# Patient Record
Sex: Female | Born: 1955
Health system: Southern US, Community
[De-identification: ages and names within clinical notes are randomized; demographics above are authoritative.]

## PROBLEM LIST (undated history)

## (undated) DIAGNOSIS — I809 Phlebitis and thrombophlebitis of unspecified site: Secondary | ICD-10-CM

## (undated) DIAGNOSIS — Z9889 Other specified postprocedural states: Secondary | ICD-10-CM

## (undated) DIAGNOSIS — E079 Disorder of thyroid, unspecified: Secondary | ICD-10-CM

## (undated) DIAGNOSIS — R112 Nausea with vomiting, unspecified: Secondary | ICD-10-CM

## (undated) DIAGNOSIS — R232 Flushing: Secondary | ICD-10-CM

## (undated) DIAGNOSIS — M1990 Unspecified osteoarthritis, unspecified site: Secondary | ICD-10-CM

## (undated) DIAGNOSIS — T7840XA Allergy, unspecified, initial encounter: Secondary | ICD-10-CM

## (undated) DIAGNOSIS — J40 Bronchitis, not specified as acute or chronic: Secondary | ICD-10-CM

## (undated) DIAGNOSIS — K219 Gastro-esophageal reflux disease without esophagitis: Secondary | ICD-10-CM

## (undated) HISTORY — PX: MOUTH SURGERY: SHX715

## (undated) HISTORY — DX: Disorder of thyroid, unspecified: E07.9

## (undated) HISTORY — PX: BREAST LUMPECTOMY: SHX2

## (undated) HISTORY — DX: Gastro-esophageal reflux disease without esophagitis: K21.9

## (undated) HISTORY — DX: Phlebitis and thrombophlebitis of unspecified site: I80.9

## (undated) HISTORY — PX: TONSILLECTOMY: SUR1361

## (undated) HISTORY — DX: Unspecified osteoarthritis, unspecified site: M19.90

## (undated) HISTORY — DX: Flushing: R23.2

## (undated) HISTORY — DX: Bronchitis, not specified as acute or chronic: J40

## (undated) HISTORY — PX: BREAST BIOPSY: SHX20

## (undated) HISTORY — DX: Allergy, unspecified, initial encounter: T78.40XA

---
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
## 1990-11-09 HISTORY — PX: CERVICAL CONE BIOPSY: SUR198

## 2013-12-06 ENCOUNTER — Encounter (HOSPITAL_COMMUNITY): Payer: Self-pay | Admitting: Emergency Medicine

## 2013-12-06 ENCOUNTER — Emergency Department (HOSPITAL_COMMUNITY)
Admission: EM | Admit: 2013-12-06 | Discharge: 2013-12-06 | Disposition: A | Discharge status: Home / Self Care | Payer: BC Managed Care – PPO | Source: Home / Self Care | Attending: Family Medicine | Admitting: Family Medicine

## 2013-12-06 DIAGNOSIS — J40 Bronchitis, not specified as acute or chronic: Secondary | ICD-10-CM

## 2013-12-06 MED ORDER — IPRATROPIUM BROMIDE 0.06 % NA SOLN: 2.0000 | Freq: Four times a day (QID) | NASAL | Status: DC

## 2013-12-06 MED ORDER — GUAIFENESIN-CODEINE 100-10 MG/5ML PO SOLN: 5.0000 mL | Freq: Every evening | ORAL | Status: DC | PRN

## 2013-12-06 MED ORDER — AZITHROMYCIN 250 MG PO TABS: 250.0000 mg | ORAL_TABLET | Freq: Every day | ORAL | Status: DC

## 2013-12-06 MED ORDER — PREDNISONE 10 MG PO TABS: 30.0000 mg | ORAL_TABLET | Freq: Every day | ORAL | Status: DC

## 2013-12-06 NOTE — ED Notes (Signed)
C/o cold sx for three weeks now States she used OTC medications but no relief.  Admits to suffering from sinus problems Sx are productive yellowish mucous cough, head pressure, nasal drip and throbbing right ear pain

## 2013-12-06 NOTE — Discharge Instructions (Signed)
Thank you for coming in today. Take prednisone and azithromycin daily for 5 days. Use codeine containing cough medication as needed. Use Atrovent nasal spray for runny nose or postnasal drip. Call or go to the emergency room if you get worse, have trouble breathing, have chest pains, or palpitations.   Bronchitis Bronchitis is inflammation of the airways that extend from the windpipe into the lungs (bronchi). The inflammation often causes mucus to develop, which leads to a cough. If the inflammation becomes severe, it may cause shortness of breath. CAUSES  Bronchitis may be caused by:   Viral infections.   Bacteria.   Cigarette smoke.   Allergens, pollutants, and other irritants.  SIGNS AND SYMPTOMS  The most common symptom of bronchitis is a frequent cough that produces mucus. Other symptoms include:  Fever.   Body aches.   Chest congestion.   Chills.   Shortness of breath.   Sore throat.  DIAGNOSIS  Bronchitis is usually diagnosed through a medical history and physical exam. Tests, such as chest X-rays, are sometimes done to rule out other conditions.  TREATMENT  You may need to avoid contact with whatever caused the problem (smoking, for example). Medicines are sometimes needed. These may include:  Antibiotics. These may be prescribed if the condition is caused by bacteria.  Cough suppressants. These may be prescribed for relief of cough symptoms.   Inhaled medicines. These may be prescribed to help open your airways and make it easier for you to breathe.   Steroid medicines. These may be prescribed for those with recurrent (chronic) bronchitis. HOME CARE INSTRUCTIONS  Get plenty of rest.   Drink enough fluids to keep your urine clear or pale yellow (unless you have a medical condition that requires fluid restriction). Increasing fluids may help thin your secretions and will prevent dehydration.   Only take over-the-counter or prescription medicines  as directed by your health care provider.  Only take antibiotics as directed. Make sure you finish them even if you start to feel better.  Avoid secondhand smoke, irritating chemicals, and strong fumes. These will make bronchitis worse. If you are a smoker, quit smoking. Consider using nicotine gum or skin patches to help control withdrawal symptoms. Quitting smoking will help your lungs heal faster.   Put a cool-mist humidifier in your bedroom at night to moisten the air. This may help loosen mucus. Change the water in the humidifier daily. You can also run the hot water in your shower and sit in the bathroom with the door closed for 5 10 minutes.   Follow up with your health care provider as directed.   Wash your hands frequently to avoid catching bronchitis again or spreading an infection to others.  SEEK MEDICAL CARE IF: Your symptoms do not improve after 1 week of treatment.  SEEK IMMEDIATE MEDICAL CARE IF:  Your fever increases.  You have chills.   You have chest pain.   You have worsening shortness of breath.   You have bloody sputum.  You faint.  You have lightheadedness.  You have a severe headache.   You vomit repeatedly. MAKE SURE YOU:   Understand these instructions.  Will watch your condition.  Will get help right away if you are not doing well or get worse. Document Released: 10/26/2005 Document Revised: 08/16/2013 Document Reviewed: 06/20/2013 [facility] Patient Information 2014 [place].

## 2013-12-06 NOTE — ED Provider Notes (Signed)
Catharina Pica is a 58 y.o. female who presents to Urgent Care today for 3 weeks of cough congestion facial pain and pressure and sore throat. Patient denies any significant shortness of breath nausea vomiting fevers or chills. She does note some mild diarrhea. Her symptoms abruptly worsened a few days ago. She's tried multiple over-the-counter medications which have not helped. Symptoms are moderate. Cough is quite bothersome at bedtime.   History reviewed. No pertinent past medical history. History  Substance Use Topics  . Smoking status: Not on file  . Smokeless tobacco: Not on file  . Alcohol Use: Not on file   ROS as above Medications: No current facility-administered medications for this encounter.   Current Outpatient Prescriptions  Medication Sig Dispense Refill  . azithromycin (ZITHROMAX) 250 MG tablet Take 1 tablet (250 mg total) by mouth daily. Take first 2 tablets together, then 1 every day until finished.  6 tablet  0  . guaiFENesin-codeine 100-10 MG/5ML syrup Take 5 mLs by mouth at bedtime as needed for cough.  120 mL  0  . ipratropium (ATROVENT) 0.06 % nasal spray Place 2 sprays into both nostrils 4 (four) times daily.  15 mL  1  . predniSONE (DELTASONE) 10 MG tablet Take 3 tablets (30 mg total) by mouth daily.  15 tablet  0    Exam:  Pulse 73  Temp(Src) 97.9 F (36.6 C) (Oral)  SpO2 96% Gen: Well NAD HEENT: EOMI,  MMM mildly tender bilateral maxillary sinus. Posterior pharynx with cobblestone. Tympanic membranes are normal appearing bilaterally. Lungs: Normal work of breathing. CTABL frequent coughing Heart: RRR no MRG Abd: NABS, Soft. NT, ND Exts: Brisk capillary refill, warm and well perfused.    Assessment and Plan: 59 y.o. female with bronchitis. Possible second sickening. Plan to treat with prednisone, azithromycin, codeine cough medication, and Atrovent nasal spray.  Discussed warning signs or symptoms. Please see discharge instructions. Patient expresses  understanding.    Gregor Hams, MD 12/06/13 [phone]

## 2014-04-25 ENCOUNTER — Ambulatory Visit (INDEPENDENT_AMBULATORY_CARE_PROVIDER_SITE_OTHER): Payer: BC Managed Care – PPO | Admitting: Internal Medicine

## 2014-04-25 ENCOUNTER — Encounter: Payer: Self-pay | Admitting: Internal Medicine

## 2014-04-25 VITALS — BP 120/76 | HR 83 | Temp 98.2°F | Ht 61.75 in | Wt 214.0 lb

## 2014-04-25 DIAGNOSIS — Z0000 Encounter for general adult medical examination without abnormal findings: Secondary | ICD-10-CM

## 2014-04-25 DIAGNOSIS — Z1239 Encounter for other screening for malignant neoplasm of breast: Secondary | ICD-10-CM

## 2014-04-25 LAB — CBC
HCT: 35.7 % — ABNORMAL LOW (ref 36.0–46.0)
Hemoglobin: 11.9 g/dL — ABNORMAL LOW (ref 12.0–15.0)
MCHC: 33.3 g/dL (ref 30.0–36.0)
MCV: 94.6 fl (ref 78.0–100.0)
Platelets: 308 10*3/uL (ref 150.0–400.0)
RBC: 3.78 Mil/uL — ABNORMAL LOW (ref 3.87–5.11)
RDW: 13.3 % (ref 11.5–15.5)
WBC: 5.2 10*3/uL (ref 4.0–10.5)

## 2014-04-25 LAB — COMPREHENSIVE METABOLIC PANEL
ALBUMIN: 4.3 g/dL (ref 3.5–5.2)
ALT: 20 U/L (ref 0–35)
AST: 24 U/L (ref 0–37)
Alkaline Phosphatase: 62 U/L (ref 39–117)
BUN: 8 mg/dL (ref 6–23)
CALCIUM: 9.3 mg/dL (ref 8.4–10.5)
CO2: 34 mEq/L — ABNORMAL HIGH (ref 19–32)
Chloride: 101 mEq/L (ref 96–112)
Creatinine, Ser: 0.6 mg/dL (ref 0.4–1.2)
GFR: 122.13 mL/min (ref >60.00)
Glucose, Bld: 112 mg/dL — ABNORMAL HIGH (ref 70–99)
Potassium: 3.2 mEq/L — ABNORMAL LOW (ref 3.5–5.1)
Sodium: 140 mEq/L (ref 135–145)
Total Bilirubin: 0.2 mg/dL (ref 0.2–1.2)
Total Protein: 7.2 g/dL (ref 6.0–8.3)

## 2014-04-25 LAB — TSH: TSH: 0.24 u[IU]/mL — ABNORMAL LOW (ref 0.35–4.50)

## 2014-04-25 LAB — LIPID PANEL
CHOL/HDL RATIO: 4
Cholesterol: 220 mg/dL — ABNORMAL HIGH (ref 0–200)
HDL: 61.2 mg/dL (ref >39.00)
LDL Cholesterol: 138 mg/dL — ABNORMAL HIGH (ref 0–99)
NONHDL: 158.8
Triglycerides: 106 mg/dL (ref 0.0–149.0)
VLDL: 21.2 mg/dL (ref 0.0–40.0)

## 2014-04-25 LAB — HEMOGLOBIN A1C: Hgb A1c MFr Bld: 5.3 % (ref 4.6–6.5)

## 2014-04-25 NOTE — Patient Instructions (Addendum)
Go to the lab for blood work then see Rosaria Ferries on your way out.  Health Maintenance, Female A healthy lifestyle and preventative care can promote health and wellness.  Maintain regular health, dental, and eye exams.  Eat a healthy diet. Foods like vegetables, fruits, whole grains, low-fat dairy products, and lean protein foods contain the nutrients you need without too many calories. Decrease your intake of foods high in solid fats, added sugars, and salt. Get information about a proper diet from your caregiver, if necessary.  Regular physical exercise is one of the most important things you can do for your health. Most adults should get at least 150 minutes of moderate-intensity exercise (any activity that increases your heart rate and causes you to sweat) each week. In addition, most adults need muscle-strengthening exercises on 2 or more days a week.   Maintain a healthy weight. The body mass index (BMI) is a screening tool to identify possible weight problems. It provides an estimate of body fat based on height and weight. Your caregiver can help determine your BMI, and can help you achieve or maintain a healthy weight. For adults 20 years and older:  A BMI below 18.5 is considered underweight.  A BMI of 18.5 to 24.9 is normal.  A BMI of 25 to 29.9 is considered overweight.  A BMI of 30 and above is considered obese.  Maintain normal blood lipids and cholesterol by exercising and minimizing your intake of saturated fat. Eat a balanced diet with plenty of fruits and vegetables. Blood tests for lipids and cholesterol should begin at age 73 and be repeated every 5 years. If your lipid or cholesterol levels are high, you are over 50, or you are a high risk for heart disease, you may need your cholesterol levels checked more frequently.Ongoing high lipid and cholesterol levels should be treated with medicines if diet and exercise are not effective.  If you smoke, find out from your caregiver  how to quit. If you do not use tobacco, do not start.  Lung cancer screening is recommended for adults aged 76 80 years who are at high risk for developing lung cancer because of a history of smoking. Yearly low-dose computed tomography (CT) is recommended for people who have at least a 30-pack-year history of smoking and are a current smoker or have quit within the past 15 years. A pack year of smoking is smoking an average of 1 pack of cigarettes a day for 1 year (for example: 1 pack a day for 30 years or 2 packs a day for 15 years). Yearly screening should continue until the smoker has stopped smoking for at least 15 years. Yearly screening should also be stopped for people who develop a health problem that would prevent them from having lung cancer treatment.  If you are pregnant, do not drink alcohol. If you are breastfeeding, be very cautious about drinking alcohol. If you are not pregnant and choose to drink alcohol, do not exceed 1 drink per day. One drink is considered to be 12 ounces (355 mL) of beer, 5 ounces (148 mL) of wine, or 1.5 ounces (44 mL) of liquor.  Avoid use of street drugs. Do not share needles with anyone. Ask for help if you need support or instructions about stopping the use of drugs.  High blood pressure causes heart disease and increases the risk of stroke. Blood pressure should be checked at least every 1 to 2 years. Ongoing high blood pressure should be treated with  medicines, if weight loss and exercise are not effective.  If you are 86 to 59 years old, ask your caregiver if you should take aspirin to prevent strokes.  Diabetes screening involves taking a blood sample to check your fasting blood sugar level. This should be done once every 3 years, after age 68, if you are within normal weight and without risk factors for diabetes. Testing should be considered at a younger age or be carried out more frequently if you are overweight and have at least 1 risk factor for  diabetes.  Breast cancer screening is essential preventative care for women. You should practice "breast self-awareness." This means understanding the normal appearance and feel of your breasts and may include breast self-examination. Any changes detected, no matter how small, should be reported to a caregiver. Women in their 33s and 30s should have a clinical breast exam (CBE) by a caregiver as part of a regular health exam every 1 to 3 years. After age 49, women should have a CBE every year. Starting at age 22, women should consider having a mammogram (breast X-ray) every year. Women who have a family history of breast cancer should talk to their caregiver about genetic screening. Women at a high risk of breast cancer should talk to their caregiver about having an MRI and a mammogram every year.  Breast cancer gene (BRCA)-related cancer risk assessment is recommended for women who have family members with BRCA-related cancers. BRCA-related cancers include breast, ovarian, tubal, and peritoneal cancers. Having family members with these cancers may be associated with an increased risk for harmful changes (mutations) in the breast cancer genes BRCA1 and BRCA2. Results of the assessment will determine the need for genetic counseling and BRCA1 and BRCA2 testing.  The Pap test is a screening test for cervical cancer. Women should have a Pap test starting at age 98. Between ages 2 and 20, Pap tests should be repeated every 2 years. Beginning at age 59, you should have a Pap test every 3 years as long as the past 3 Pap tests have been normal. If you had a hysterectomy for a problem that was not cancer or a condition that could lead to cancer, then you no longer need Pap tests. If you are between ages 44 and 41, and you have had normal Pap tests going back 10 years, you no longer need Pap tests. If you have had past treatment for cervical cancer or a condition that could lead to cancer, you need Pap tests and  screening for cancer for at least 20 years after your treatment. If Pap tests have been discontinued, risk factors (such as a new sexual partner) need to be reassessed to determine if screening should be resumed. Some women have medical problems that increase the chance of getting cervical cancer. In these cases, your caregiver may recommend more frequent screening and Pap tests.  The human papillomavirus (HPV) test is an additional test that may be used for cervical cancer screening. The HPV test looks for the virus that can cause the cell changes on the cervix. The cells collected during the Pap test can be tested for HPV. The HPV test could be used to screen women aged 3 years and older, and should be used in women of any age who have unclear Pap test results. After the age of 79, women should have HPV testing at the same frequency as a Pap test.  Colorectal cancer can be detected and often prevented. Most routine colorectal cancer  screening begins at the age of 81 and continues through age 77. However, your caregiver may recommend screening at an earlier age if you have risk factors for colon cancer. On a yearly basis, your caregiver may provide home test kits to check for hidden blood in the stool. Use of a small camera at the end of a tube, to directly examine the colon (sigmoidoscopy or colonoscopy), can detect the earliest forms of colorectal cancer. Talk to your caregiver about this at age 19, when routine screening begins. Direct examination of the colon should be repeated every 5 to 10 years through age 11, unless early forms of pre-cancerous polyps or small growths are found.  Hepatitis C blood testing is recommended for all people born from [birth year] through 1966 and any individual with known risks for hepatitis C.  Practice safe sex. Use condoms and avoid high-risk sexual practices to reduce the spread of sexually transmitted infections (STIs). Sexually active women aged 52 and younger should be  checked for Chlamydia, which is a common sexually transmitted infection. Older women with new or multiple partners should also be tested for Chlamydia. Testing for other STIs is recommended if you are sexually active and at increased risk.  Osteoporosis is a disease in which the bones lose minerals and strength with aging. This can result in serious bone fractures. The risk of osteoporosis can be identified using a bone density scan. Women ages 90 and over and women at risk for fractures or osteoporosis should discuss screening with their caregivers. Ask your caregiver whether you should be taking a calcium supplement or vitamin D to reduce the rate of osteoporosis.  Menopause can be associated with physical symptoms and risks. Hormone replacement therapy is available to decrease symptoms and risks. You should talk to your caregiver about whether hormone replacement therapy is right for you.  Use sunscreen. Apply sunscreen liberally and repeatedly throughout the day. You should seek shade when your shadow is shorter than you. Protect yourself by wearing long sleeves, pants, a wide-brimmed hat, and sunglasses year round, whenever you are outdoors.  Notify your caregiver of new moles or changes in moles, especially if there is a change in shape or color. Also notify your caregiver if a mole is larger than the size of a pencil eraser.  Stay current with your immunizations. Document Released: 05/11/2011 Document Revised: 02/20/2013 Document Reviewed: 05/11/2011 [facility] Patient Information 2014 [place].

## 2014-04-25 NOTE — Progress Notes (Signed)
Pre visit review using our clinic review tool, if applicable. No additional management support is needed unless otherwise documented below in the visit note. 

## 2014-04-25 NOTE — Progress Notes (Signed)
HPI  Pt presents to the clinic today to establish care. She has not had a PCP since moving to the area 2 years ago. She has no concerns today.  Flu: never Tetanus: 2008 Zostovax: 2008 LMP: post menopausal Pap Smear: 2011 Mammogram: 2011 Colon Screening: never Eye Doctor: as needed Dentist: biannually  Past Medical History  Diagnosis Date  . Allergy   . Arthritis   . Blood transfusion without reported diagnosis 1977  . Phlebitis   . GERD (gastroesophageal reflux disease)     Current Outpatient Prescriptions  Medication Sig Dispense Refill  . COD LIVER OIL PO Take 2 capsules by mouth daily.      . Flaxseed, Linseed, (FLAX SEED OIL PO) Take 2 capsules by mouth daily.      Marland Kitchen ipratropium (ATROVENT) 0.06 % nasal spray Place 2 sprays into both nostrils 4 (four) times daily.      Marland Kitchen levocetirizine (XYZAL) 5 MG tablet Take 5 mg by mouth every evening.      . NON FORMULARY Take 2 capsules by mouth daily. 24/7 Solution--Sugar control       No current facility-administered medications for this visit.    Allergies  Allergen Reactions  . Sulfa Antibiotics     Family History  Problem Relation Age of Onset  . Arthritis Mother   . Cancer Mother     Breast  . Hyperlipidemia Mother   . Heart disease Mother   . Hypertension Mother   . Stroke Brother   . Diabetes Brother   . Hypertension Daughter     History   Social History  . Marital Status: Married    Spouse Name: N/A    Number of Children: N/A  . Years of Education: N/A   Occupational History  . Not on file.   Social History Main Topics  . Smoking status: Never Smoker   . Smokeless tobacco: Never Used  . Alcohol Use: No  . Drug Use: Not on file  . Sexual Activity: Not on file   Other Topics Concern  . Not on file   Social History Narrative  . No narrative on file    ROS:  Constitutional: Denies fever, malaise, fatigue, headache or abrupt weight changes.  HEENT: Denies eye pain, eye redness, ear pain,  ringing in the ears, wax buildup, runny nose, nasal congestion, bloody nose, or sore throat. Respiratory: Denies difficulty breathing, shortness of breath, cough or sputum production.   Cardiovascular: Denies chest pain, chest tightness, palpitations or swelling in the hands or feet.  Gastrointestinal: Denies abdominal pain, bloating, constipation, diarrhea or blood in the stool.  GU: Denies frequency, urgency, pain with urination, blood in urine, odor or discharge. Musculoskeletal: Denies decrease in range of motion, difficulty with gait, muscle pain or joint pain and swelling.  Skin: Denies redness, rashes, lesions or ulcercations.  Neurological: Pt reports lightheadedness. Denies dizziness, difficulty with memory, difficulty with speech or problems with balance and coordination.   No other specific complaints in a complete review of systems (except as listed in HPI above).  PE:  BP 120/76  Pulse 83  Temp(Src) 98.2 F (36.8 C) (Oral)  Ht 5' 1.75" (1.568 m)  Wt 214 lb (97.07 kg)  BMI 39.48 kg/m2  SpO2 98% Wt Readings from Last 3 Encounters:  04/25/14 214 lb (97.07 kg)    General: Appears their stated age, well developed, well nourished in NAD. HEENT: Head: normal shape and size; Eyes: sclera white, no icterus, conjunctiva pink, PERRLA and EOMs intact;  Ears: Tm's gray and intact, normal light reflex; Nose: mucosa pink and moist, septum midline; Throat/Mouth: Teeth present, mucosa pink and moist, no lesions or ulcerations noted.  Neck: Normal range of motion. Neck supple, trachea midline. No massses, lumps or thyromegaly present.  Cardiovascular: Normal rate and rhythm. S1,S2 noted.  No murmur, rubs or gallops noted. No JVD or BLE edema. No carotid bruits noted. Pulmonary/Chest: Normal effort and positive vesicular breath sounds. No respiratory distress. No wheezes, rales or ronchi noted.  Abdomen: Soft and nontender. Normal bowel sounds, no bruits noted. No distention or masses noted.  Liver, spleen and kidneys non palpable. Musculoskeletal: Normal range of motion. No signs of joint swelling. No difficulty with gait.  Neurological: Alert and oriented. Cranial nerves II-XII intact. Coordination normal. +DTRs bilaterally. Psychiatric: Mood and affect normal. Behavior is normal. Judgment and thought content normal.      Assessment and Plan:  Preventative Health Maintenance:  Will check basic screening labs today Encouraged her to work on diet and exercise Will set up mammogram, colon screening and gyn for pap smear  RTC in 1 year or sooner if needed

## 2014-04-26 ENCOUNTER — Telehealth: Payer: Self-pay | Admitting: Internal Medicine

## 2014-04-26 NOTE — Telephone Encounter (Signed)
Pt returned your call regarding lab results.  Call back # [phone]

## 2014-04-27 ENCOUNTER — Ambulatory Visit
Admission: RE | Admit: 2014-04-27 | Discharge: 2014-04-27 | Disposition: A | Discharge status: Home / Self Care | Payer: BC Managed Care – PPO | Source: Ambulatory Visit | Attending: Internal Medicine | Admitting: Internal Medicine

## 2014-04-27 DIAGNOSIS — Z1239 Encounter for other screening for malignant neoplasm of breast: Secondary | ICD-10-CM

## 2014-04-30 ENCOUNTER — Other Ambulatory Visit: Payer: Self-pay | Admitting: Internal Medicine

## 2014-04-30 ENCOUNTER — Other Ambulatory Visit: Payer: Self-pay

## 2014-04-30 DIAGNOSIS — R928 Other abnormal and inconclusive findings on diagnostic imaging of breast: Secondary | ICD-10-CM

## 2014-04-30 MED ORDER — LEVOTHYROXINE SODIUM 25 MCG PO TABS: 25.0000 ug | ORAL_TABLET | Freq: Every day | ORAL | Status: DC

## 2014-04-30 NOTE — Addendum Note (Signed)
Addended by: Lurlean Nanny on: 04/30/2014 10:21 AM   Modules accepted: Orders

## 2014-05-07 ENCOUNTER — Ambulatory Visit
Admission: RE | Admit: 2014-05-07 | Discharge: 2014-05-07 | Disposition: A | Discharge status: Home / Self Care | Payer: BC Managed Care – PPO | Source: Ambulatory Visit | Attending: Internal Medicine | Admitting: Internal Medicine

## 2014-05-07 ENCOUNTER — Other Ambulatory Visit: Payer: Self-pay | Admitting: Internal Medicine

## 2014-05-07 DIAGNOSIS — R928 Other abnormal and inconclusive findings on diagnostic imaging of breast: Secondary | ICD-10-CM

## 2014-05-07 DIAGNOSIS — N6320 Unspecified lump in the left breast, unspecified quadrant: Secondary | ICD-10-CM

## 2014-05-08 ENCOUNTER — Ambulatory Visit (INDEPENDENT_AMBULATORY_CARE_PROVIDER_SITE_OTHER): Payer: BC Managed Care – PPO | Admitting: Family Medicine

## 2014-05-08 ENCOUNTER — Encounter: Payer: Self-pay | Admitting: Family Medicine

## 2014-05-08 VITALS — BP 148/93 | HR 77 | Ht 61.0 in | Wt 212.0 lb

## 2014-05-08 DIAGNOSIS — J309 Allergic rhinitis, unspecified: Secondary | ICD-10-CM | POA: Insufficient documentation

## 2014-05-08 DIAGNOSIS — E034 Atrophy of thyroid (acquired): Secondary | ICD-10-CM

## 2014-05-08 DIAGNOSIS — J302 Other seasonal allergic rhinitis: Secondary | ICD-10-CM

## 2014-05-08 DIAGNOSIS — M129 Arthropathy, unspecified: Secondary | ICD-10-CM

## 2014-05-08 DIAGNOSIS — J3089 Other allergic rhinitis: Secondary | ICD-10-CM

## 2014-05-08 DIAGNOSIS — E0789 Other specified disorders of thyroid: Secondary | ICD-10-CM

## 2014-05-08 DIAGNOSIS — E039 Hypothyroidism, unspecified: Secondary | ICD-10-CM | POA: Insufficient documentation

## 2014-05-08 DIAGNOSIS — M1990 Unspecified osteoarthritis, unspecified site: Secondary | ICD-10-CM | POA: Insufficient documentation

## 2014-05-08 DIAGNOSIS — E038 Other specified hypothyroidism: Secondary | ICD-10-CM

## 2014-05-08 DIAGNOSIS — Z124 Encounter for screening for malignant neoplasm of cervix: Secondary | ICD-10-CM

## 2014-05-08 DIAGNOSIS — Z803 Family history of malignant neoplasm of breast: Secondary | ICD-10-CM | POA: Insufficient documentation

## 2014-05-08 DIAGNOSIS — Z01419 Encounter for gynecological examination (general) (routine) without abnormal findings: Secondary | ICD-10-CM

## 2014-05-08 DIAGNOSIS — K219 Gastro-esophageal reflux disease without esophagitis: Secondary | ICD-10-CM | POA: Insufficient documentation

## 2014-05-08 DIAGNOSIS — Z1151 Encounter for screening for human papillomavirus (HPV): Secondary | ICD-10-CM

## 2014-05-08 NOTE — Patient Instructions (Signed)
Preventive Care for Adults A healthy lifestyle and preventive care can promote health and wellness. Preventive health guidelines for women include the following key practices.  A routine yearly physical is a good way to check with your health care provider about your health and preventive screening. It is a chance to share any concerns and updates on your health and to receive a thorough exam.  Visit your dentist for a routine exam and preventive care every 6 months. Brush your teeth twice a day and floss once a day. Good oral hygiene prevents tooth decay and gum disease.  The frequency of eye exams is based on your age, health, family medical history, use of contact lenses, and other factors. Follow your health care provider's recommendations for frequency of eye exams.  Eat a healthy diet. Foods like vegetables, fruits, whole grains, low-fat dairy products, and lean protein foods contain the nutrients you need without too many calories. Decrease your intake of foods high in solid fats, added sugars, and salt. Eat the right amount of calories for you.Get information about a proper diet from your health care provider, if necessary.  Regular physical exercise is one of the most important things you can do for your health. Most adults should get at least 150 minutes of moderate-intensity exercise (any activity that increases your heart rate and causes you to sweat) each week. In addition, most adults need muscle-strengthening exercises on 2 or more days a week.  Maintain a healthy weight. The body mass index (BMI) is a screening tool to identify possible weight problems. It provides an estimate of body fat based on height and weight. Your health care provider can find your BMI, and can help you achieve or maintain a healthy weight.For adults 20 years and older:  A BMI below 18.5 is considered underweight.  A BMI of 18.5 to 24.9 is normal.  A BMI of 25 to 29.9 is considered overweight.  A BMI of  30 and above is considered obese.  Maintain normal blood lipids and cholesterol levels by exercising and minimizing your intake of saturated fat. Eat a balanced diet with plenty of fruit and vegetables. Blood tests for lipids and cholesterol should begin at age 52 and be repeated every 5 years. If your lipid or cholesterol levels are high, you are over 50, or you are at high risk for heart disease, you may need your cholesterol levels checked more frequently.Ongoing high lipid and cholesterol levels should be treated with medicines if diet and exercise are not working.  If you smoke, find out from your health care provider how to quit. If you do not use tobacco, do not start.  Lung cancer screening is recommended for adults aged 37-80 years who are at high risk for developing lung cancer because of a history of smoking. A yearly low-dose CT scan of the lungs is recommended for people who have at least a 30-pack-year history of smoking and are a current smoker or have quit within the past 15 years. A pack year of smoking is smoking an average of 1 pack of cigarettes a day for 1 year (for example: 1 pack a day for 30 years or 2 packs a day for 15 years). Yearly screening should continue until the smoker has stopped smoking for at least 15 years. Yearly screening should be stopped for people who develop a health problem that would prevent them from having lung cancer treatment.  If you are pregnant, do not drink alcohol. If you are breastfeeding,  be very cautious about drinking alcohol. If you are not pregnant and choose to drink alcohol, do not have more than 1 drink per day. One drink is considered to be 12 ounces (355 mL) of beer, 5 ounces (148 mL) of wine, or 1.5 ounces (44 mL) of liquor.  Avoid use of street drugs. Do not share needles with anyone. Ask for help if you need support or instructions about stopping the use of drugs.  High blood pressure causes heart disease and increases the risk of  stroke. Your blood pressure should be checked at least every 1 to 2 years. Ongoing high blood pressure should be treated with medicines if weight loss and exercise do not work.  If you are 75-52 years old, ask your health care provider if you should take aspirin to prevent strokes.  Diabetes screening involves taking a blood sample to check your fasting blood sugar level. This should be done once every 3 years, after age 15, if you are within normal weight and without risk factors for diabetes. Testing should be considered at a younger age or be carried out more frequently if you are overweight and have at least 1 risk factor for diabetes.  Breast cancer screening is essential preventive care for women. You should practice "breast self-awareness." This means understanding the normal appearance and feel of your breasts and may include breast self-examination. Any changes detected, no matter how small, should be reported to a health care provider. Women in their 58s and 30s should have a clinical breast exam (CBE) by a health care provider as part of a regular health exam every 1 to 3 years. After age 16, women should have a CBE every year. Starting at age 53, women should consider having a mammogram (breast X-ray test) every year. Women who have a family history of breast cancer should talk to their health care provider about genetic screening. Women at a high risk of breast cancer should talk to their health care providers about having an MRI and a mammogram every year.  Breast cancer gene (BRCA)-related cancer risk assessment is recommended for women who have family members with BRCA-related cancers. BRCA-related cancers include breast, ovarian, tubal, and peritoneal cancers. Having family members with these cancers may be associated with an increased risk for harmful changes (mutations) in the breast cancer genes BRCA1 and BRCA2. Results of the assessment will determine the need for genetic counseling and  BRCA1 and BRCA2 testing.  Routine pelvic exams to screen for cancer are no longer recommended for nonpregnant women who are considered low risk for cancer of the pelvic organs (ovaries, uterus, and vagina) and who do not have symptoms. Ask your health care provider if a screening pelvic exam is right for you.  If you have had past treatment for cervical cancer or a condition that could lead to cancer, you need Pap tests and screening for cancer for at least 20 years after your treatment. If Pap tests have been discontinued, your risk factors (such as having a new sexual partner) need to be reassessed to determine if screening should be resumed. Some women have medical problems that increase the chance of getting cervical cancer. In these cases, your health care provider may recommend more frequent screening and Pap tests.  The HPV test is an additional test that may be used for cervical cancer screening. The HPV test looks for the virus that can cause the cell changes on the cervix. The cells collected during the Pap test can be  tested for HPV. The HPV test could be used to screen women aged 47 years and older, and should be used in women of any age who have unclear Pap test results. After the age of 36, women should have HPV testing at the same frequency as a Pap test.  Colorectal cancer can be detected and often prevented. Most routine colorectal cancer screening begins at the age of 38 years and continues through age 58 years. However, your health care provider may recommend screening at an earlier age if you have risk factors for colon cancer. On a yearly basis, your health care provider may provide home test kits to check for hidden blood in the stool. Use of a small camera at the end of a tube, to directly examine the colon (sigmoidoscopy or colonoscopy), can detect the earliest forms of colorectal cancer. Talk to your health care provider about this at age 64, when routine screening begins. Direct  exam of the colon should be repeated every 5-10 years through age 21 years, unless early forms of pre-cancerous polyps or small growths are found.  People who are at an increased risk for hepatitis B should be screened for this virus. You are considered at high risk for hepatitis B if:  You were born in a country where hepatitis B occurs often. Talk with your health care provider about which countries are considered high risk.  Your parents were born in a high-risk country and you have not received a shot to protect against hepatitis B (hepatitis B vaccine).  You have HIV or AIDS.  You use needles to inject street drugs.  You live with, or have sex with, someone who has Hepatitis B.  You get hemodialysis treatment.  You take certain medicines for conditions like cancer, organ transplantation, and autoimmune conditions.  Hepatitis C blood testing is recommended for all people born from [birth year] through 1965 and any individual with known risks for hepatitis C.  Practice safe sex. Use condoms and avoid high-risk sexual practices to reduce the spread of sexually transmitted infections (STIs). STIs include gonorrhea, chlamydia, syphilis, trichomonas, herpes, HPV, and human immunodeficiency virus (HIV). Herpes, HIV, and HPV are viral illnesses that have no cure. They can result in disability, cancer, and death.  You should be screened for sexually transmitted illnesses (STIs) including gonorrhea and chlamydia if:  You are sexually active and are younger than 24 years.  You are older than 24 years and your health care provider tells you that you are at risk for this type of infection.  Your sexual activity has changed since you were last screened and you are at an increased risk for chlamydia or gonorrhea. Ask your health care provider if you are at risk.  If you are at risk of being infected with HIV, it is recommended that you take a prescription medicine daily to prevent HIV infection. This is  called preexposure prophylaxis (PrEP). You are considered at risk if:  You are a heterosexual woman, are sexually active, and are at increased risk for HIV infection.  You take drugs by injection.  You are sexually active with a partner who has HIV.  Talk with your health care provider about whether you are at high risk of being infected with HIV. If you choose to begin PrEP, you should first be tested for HIV. You should then be tested every 3 months for as long as you are taking PrEP.  Osteoporosis is a disease in which the bones lose minerals and strength  with aging. This can result in serious bone fractures or breaks. The risk of osteoporosis can be identified using a bone density scan. Women ages 65 years and over and women at risk for fractures or osteoporosis should discuss screening with their health care providers. Ask your health care provider whether you should take a calcium supplement or vitamin D to reduce the rate of osteoporosis.  Menopause can be associated with physical symptoms and risks. Hormone replacement therapy is available to decrease symptoms and risks. You should talk to your health care provider about whether hormone replacement therapy is right for you.  Use sunscreen. Apply sunscreen liberally and repeatedly throughout the day. You should seek shade when your shadow is shorter than you. Protect yourself by wearing long sleeves, pants, a wide-brimmed hat, and sunglasses year round, whenever you are outdoors.  Once a month, do a whole body skin exam, using a mirror to look at the skin on your back. Tell your health care provider of new moles, moles that have irregular borders, moles that are larger than a pencil eraser, or moles that have changed in shape or color.  Stay current with required vaccines (immunizations).  Influenza vaccine. All adults should be immunized every year.  Tetanus, diphtheria, and acellular pertussis (Td, Tdap) vaccine. Pregnant women should  receive 1 dose of Tdap vaccine during each pregnancy. The dose should be obtained regardless of the length of time since the last dose. Immunization is preferred during the 27th-36th week of gestation. An adult who has not previously received Tdap or who does not know her vaccine status should receive 1 dose of Tdap. This initial dose should be followed by tetanus and diphtheria toxoids (Td) booster doses every 10 years. Adults with an unknown or incomplete history of completing a 3-dose immunization series with Td-containing vaccines should begin or complete a primary immunization series including a Tdap dose. Adults should receive a Td booster every 10 years.  Varicella vaccine. An adult without evidence of immunity to varicella should receive 2 doses or a second dose if she has previously received 1 dose. Pregnant females who do not have evidence of immunity should receive the first dose after pregnancy. This first dose should be obtained before leaving the health care facility. The second dose should be obtained 4-8 weeks after the first dose.  Human papillomavirus (HPV) vaccine. Females aged 13-26 years who have not received the vaccine previously should obtain the 3-dose series. The vaccine is not recommended for use in pregnant females. However, pregnancy testing is not needed before receiving a dose. If a female is found to be pregnant after receiving a dose, no treatment is needed. In that case, the remaining doses should be delayed until after the pregnancy. Immunization is recommended for any person with an immunocompromised condition through the age of 26 years if she did not get any or all doses earlier. During the 3-dose series, the second dose should be obtained 4-8 weeks after the first dose. The third dose should be obtained 24 weeks after the first dose and 16 weeks after the second dose.  Zoster vaccine. One dose is recommended for adults aged 60 years or older unless certain conditions are  present.  Measles, mumps, and rubella (MMR) vaccine. Adults born before 1957 generally are considered immune to measles and mumps. Adults born in 1957 or later should have 1 or more doses of MMR vaccine unless there is a contraindication to the vaccine or there is laboratory evidence of immunity to   each of the three diseases. A routine second dose of MMR vaccine should be obtained at least 28 days after the first dose for students attending postsecondary schools, health care workers, or international travelers. People who received inactivated measles vaccine or an unknown type of measles vaccine during 1963-1967 should receive 2 doses of MMR vaccine. People who received inactivated mumps vaccine or an unknown type of mumps vaccine before 1979 and are at high risk for mumps infection should consider immunization with 2 doses of MMR vaccine. For females of childbearing age, rubella immunity should be determined. If there is no evidence of immunity, females who are not pregnant should be vaccinated. If there is no evidence of immunity, females who are pregnant should delay immunization until after pregnancy. Unvaccinated health care workers born before 1957 who lack laboratory evidence of measles, mumps, or rubella immunity or laboratory confirmation of disease should consider measles and mumps immunization with 2 doses of MMR vaccine or rubella immunization with 1 dose of MMR vaccine.  Pneumococcal 13-valent conjugate (PCV13) vaccine. When indicated, a person who is uncertain of her immunization history and has no record of immunization should receive the PCV13 vaccine. An adult aged 19 years or older who has certain medical conditions and has not been previously immunized should receive 1 dose of PCV13 vaccine. This PCV13 should be followed with a dose of pneumococcal polysaccharide (PPSV23) vaccine. The PPSV23 vaccine dose should be obtained at least 8 weeks after the dose of PCV13 vaccine. An adult aged 19  years or older who has certain medical conditions and previously received 1 or more doses of PPSV23 vaccine should receive 1 dose of PCV13. The PCV13 vaccine dose should be obtained 1 or more years after the last PPSV23 vaccine dose.  Pneumococcal polysaccharide (PPSV23) vaccine. When PCV13 is also indicated, PCV13 should be obtained first. All adults aged 65 years and older should be immunized. An adult younger than age 65 years who has certain medical conditions should be immunized. Any person who resides in a nursing home or long-term care facility should be immunized. An adult smoker should be immunized. People with an immunocompromised condition and certain other conditions should receive both PCV13 and PPSV23 vaccines. People with human immunodeficiency virus (HIV) infection should be immunized as soon as possible after diagnosis. Immunization during chemotherapy or radiation therapy should be avoided. Routine use of PPSV23 vaccine is not recommended for American Indians, [place] Natives, or people younger than 65 years unless there are medical conditions that require PPSV23 vaccine. When indicated, people who have unknown immunization and have no record of immunization should receive PPSV23 vaccine. One-time revaccination 5 years after the first dose of PPSV23 is recommended for people aged 19-64 years who have chronic kidney failure, nephrotic syndrome, asplenia, or immunocompromised conditions. People who received 1-2 doses of PPSV23 before age 65 years should receive another dose of PPSV23 vaccine at age 65 years or later if at least 5 years have passed since the previous dose. Doses of PPSV23 are not needed for people immunized with PPSV23 at or after age 65 years.  Meningococcal vaccine. Adults with asplenia or persistent complement component deficiencies should receive 2 doses of quadrivalent meningococcal conjugate (MenACWY-D) vaccine. The doses should be obtained at least 2 months apart.  Microbiologists working with certain meningococcal bacteria, military recruits, people at risk during an outbreak, and people who travel to or live in countries with a high rate of meningitis should be immunized. A first-year college student up through age   21 years who is living in a residence hall should receive a dose if she did not receive a dose on or after her 16th birthday. Adults who have certain high-risk conditions should receive one or more doses of vaccine.  Hepatitis A vaccine. Adults who wish to be protected from this disease, have certain high-risk conditions, work with hepatitis A-infected animals, work in hepatitis A research labs, or travel to or work in countries with a high rate of hepatitis A should be immunized. Adults who were previously unvaccinated and who anticipate close contact with an international adoptee during the first 60 days after arrival in the [place] from a country with a high rate of hepatitis A should be immunized.  Hepatitis B vaccine. Adults who wish to be protected from this disease, have certain high-risk conditions, may be exposed to blood or other infectious body fluids, are household contacts or sex partners of hepatitis B positive people, are clients or workers in certain care facilities, or travel to or work in countries with a high rate of hepatitis B should be immunized.  Haemophilus influenzae type b (Hib) vaccine. A previously unvaccinated person with asplenia or sickle cell disease or having a scheduled splenectomy should receive 1 dose of Hib vaccine. Regardless of previous immunization, a recipient of a hematopoietic stem cell transplant should receive a 3-dose series 6-12 months after her successful transplant. Hib vaccine is not recommended for adults with HIV infection. Preventive Services / Frequency Ages 43 to 39years  Blood pressure check.** / Every 1 to 2 years.  Lipid and cholesterol check.** / Every 5 years beginning at age  75.  Clinical breast exam.** / Every 3 years for women in their 32s and 74s.  BRCA-related cancer risk assessment.** / For women who have family members with a BRCA-related cancer (breast, ovarian, tubal, or peritoneal cancers).  Pap test.** / Every 2 years from ages 65 through 91. Every 3 years starting at age 34 through age 93 or 72 with a history of 3 consecutive normal Pap tests.  HPV screening.** / Every 3 years from ages 46 through ages 53 to 26 with a history of 3 consecutive normal Pap tests.  Hepatitis C blood test.** / For any individual with known risks for hepatitis C.  Skin self-exam. / Monthly.  Influenza vaccine. / Every year.  Tetanus, diphtheria, and acellular pertussis (Tdap, Td) vaccine.** / Consult your health care provider. Pregnant women should receive 1 dose of Tdap vaccine during each pregnancy. 1 dose of Td every 10 years.  Varicella vaccine.** / Consult your health care provider. Pregnant females who do not have evidence of immunity should receive the first dose after pregnancy.  HPV vaccine. / 3 doses over 6 months, if 70 and younger. The vaccine is not recommended for use in pregnant females. However, pregnancy testing is not needed before receiving a dose.  Measles, mumps, rubella (MMR) vaccine.** / You need at least 1 dose of MMR if you were born in 1957 or later. You may also need a 2nd dose. For females of childbearing age, rubella immunity should be determined. If there is no evidence of immunity, females who are not pregnant should be vaccinated. If there is no evidence of immunity, females who are pregnant should delay immunization until after pregnancy.  Pneumococcal 13-valent conjugate (PCV13) vaccine.** / Consult your health care provider.  Pneumococcal polysaccharide (PPSV23) vaccine.** / 1 to 2 doses if you smoke cigarettes or if you have certain conditions.  Meningococcal vaccine.** /  1 dose if you are age 70 to 51 years and a [place] living in a residence hall, or have one of several medical conditions, you need to get vaccinated against meningococcal disease. You may also need additional booster doses.  Hepatitis A vaccine.** / Consult your health care provider.  Hepatitis B vaccine.** / Consult your health care provider.  Haemophilus influenzae type b (Hib) vaccine.** / Consult your health care provider. Ages 40 to 64years  Blood pressure check.** / Every 1 to 2 years.  Lipid and cholesterol check.** / Every 5 years beginning at age 58 years.  Lung cancer screening. / Every year if you are aged 56-80 years and have a 30-pack-year history of smoking and currently smoke or have quit within the past 15 years. Yearly screening is stopped once you have quit smoking for at least 15 years or develop a health problem that would prevent you from having lung cancer treatment.  Clinical breast exam.** / Every year after age 35 years.  BRCA-related cancer risk assessment.** / For women who have family members with a BRCA-related cancer (breast, ovarian, tubal, or peritoneal cancers).  Mammogram.** / Every year beginning at age 109 years and continuing for as long as you are in good health. Consult with your health care provider.  Pap test.** / Every 3 years starting at age 44 years through age 94 or 70 years with a history of 3 consecutive normal Pap tests.  HPV screening.** / Every 3 years from ages 109 years through ages 50 to 30 years with a history of 3 consecutive normal Pap tests.  Fecal occult blood test (FOBT) of stool. / Every year beginning at age 73 years and continuing until age 59 years. You may not need to do this test if you get a colonoscopy every 10 years.  Flexible sigmoidoscopy or colonoscopy.** / Every 5 years for a flexible sigmoidoscopy or every 10 years for a colonoscopy beginning at age 68 years and continuing until age 12 years.  Hepatitis C blood test.** / For all people born from [birth year] through  1965 and any individual with known risks for hepatitis C.  Skin self-exam. / Monthly.  Influenza vaccine. / Every year.  Tetanus, diphtheria, and acellular pertussis (Tdap/Td) vaccine.** / Consult your health care provider. Pregnant women should receive 1 dose of Tdap vaccine during each pregnancy. 1 dose of Td every 10 years.  Varicella vaccine.** / Consult your health care provider. Pregnant females who do not have evidence of immunity should receive the first dose after pregnancy.  Zoster vaccine.** / 1 dose for adults aged 2 years or older.  Measles, mumps, rubella (MMR) vaccine.** / You need at least 1 dose of MMR if you were born in 1957 or later. You may also need a 2nd dose. For females of childbearing age, rubella immunity should be determined. If there is no evidence of immunity, females who are not pregnant should be vaccinated. If there is no evidence of immunity, females who are pregnant should delay immunization until after pregnancy.  Pneumococcal 13-valent conjugate (PCV13) vaccine.** / Consult your health care provider.  Pneumococcal polysaccharide (PPSV23) vaccine.** / 1 to 2 doses if you smoke cigarettes or if you have certain conditions.  Meningococcal vaccine.** / Consult your health care provider.  Hepatitis A vaccine.** / Consult your health care provider.  Hepatitis B vaccine.** / Consult your health care provider.  Haemophilus influenzae type b (Hib) vaccine.** / Consult your health care provider. Ages 48 years  and over  Blood pressure check.** / Every 1 to 2 years.  Lipid and cholesterol check.** / Every 5 years beginning at age 84 years.  Lung cancer screening. / Every year if you are aged 50-80 years and have a 30-pack-year history of smoking and currently smoke or have quit within the past 15 years. Yearly screening is stopped once you have quit smoking for at least 15 years or develop a health problem that would prevent you from having lung cancer  treatment.  Clinical breast exam.** / Every year after age 24 years.  BRCA-related cancer risk assessment.** / For women who have family members with a BRCA-related cancer (breast, ovarian, tubal, or peritoneal cancers).  Mammogram.** / Every year beginning at age 14 years and continuing for as long as you are in good health. Consult with your health care provider.  Pap test.** / Every 3 years starting at age 17 years through age 31 or 74 years with 3 consecutive normal Pap tests. Testing can be stopped between 65 and 70 years with 3 consecutive normal Pap tests and no abnormal Pap or HPV tests in the past 10 years.  HPV screening.** / Every 3 years from ages 30 years through ages 70 or 28 years with a history of 3 consecutive normal Pap tests. Testing can be stopped between 65 and 70 years with 3 consecutive normal Pap tests and no abnormal Pap or HPV tests in the past 10 years.  Fecal occult blood test (FOBT) of stool. / Every year beginning at age 64 years and continuing until age 92 years. You may not need to do this test if you get a colonoscopy every 10 years.  Flexible sigmoidoscopy or colonoscopy.** / Every 5 years for a flexible sigmoidoscopy or every 10 years for a colonoscopy beginning at age 73 years and continuing until age 39 years.  Hepatitis C blood test.** / For all people born from [birth year] through 1966 and any individual with known risks for hepatitis C.  Osteoporosis screening.** / A one-time screening for women ages 35 years and over and women at risk for fractures or osteoporosis.  Skin self-exam. / Monthly.  Influenza vaccine. / Every year.  Tetanus, diphtheria, and acellular pertussis (Tdap/Td) vaccine.** / 1 dose of Td every 10 years.  Varicella vaccine.** / Consult your health care provider.  Zoster vaccine.** / 1 dose for adults aged 59 years or older.  Pneumococcal 13-valent conjugate (PCV13) vaccine.** / Consult your health care provider.  Pneumococcal  polysaccharide (PPSV23) vaccine.** / 1 dose for all adults aged 8 years and older.  Meningococcal vaccine.** / Consult your health care provider.  Hepatitis A vaccine.** / Consult your health care provider.  Hepatitis B vaccine.** / Consult your health care provider.  Haemophilus influenzae type b (Hib) vaccine.** / Consult your health care provider. ** Family history and personal history of risk and conditions may change your health care provider's recommendations. Document Released: 12/22/2001 Document Revised: 10/31/2013 Document Reviewed: 03/23/2011 [facility] Patient Information 2015 [place], [place]. This information is not intended to replace advice given to you by your health care provider. Make sure you discuss any questions you have with your health care provider.

## 2014-05-08 NOTE — Progress Notes (Signed)
  Subjective:     Jill Gonzalez is a 58 y.o. female and is here for a comprehensive physical exam. The patient reports problems - needs another breast biopsy and pap smear.  Last was in 2011.  H/o conization of cervix.  Works as a Network engineer..  History   Social History  . Marital Status: Married    Spouse Name: N/A    Number of Children: N/A  . Years of Education: N/A   Occupational History  . Not on file.   Social History Main Topics  . Smoking status: Never Smoker   . Smokeless tobacco: Never Used  . Alcohol Use: No  . Drug Use: No  . Sexual Activity: Not Currently   Other Topics Concern  . Not on file   Social History Narrative  . No narrative on file   Health Maintenance  Topic Date Due  . Pap Smear  04/07/1973  . Tetanus/tdap  04/07/1974  . Colonoscopy  04/07/2005  . Influenza Vaccine  06/09/2014  . Mammogram  05/07/2016    The following portions of the patient's history were reviewed and updated as appropriate: allergies, current medications, past family history, past medical history, past social history, past surgical history and problem list.  Review of Systems A comprehensive review of systems was negative except for: bruising and soreness in her lower extremeties and soreness of her breast   Objective:    BP 148/93  Pulse 77  Ht 5\' 1"  (1.549 m)  Wt 212 lb (96.163 kg)  BMI 40.08 kg/m2 General appearance: alert, cooperative and appears stated age Head: Normocephalic, without obvious abnormality, atraumatic Neck: no adenopathy, supple, symmetrical, trachea midline and thyroid not enlarged, symmetric, no tenderness/mass/nodules Lungs: clear to auscultation bilaterally Breasts: 2 small 1 cm nodules in left breast in lower outer quadrants which are mobile and tender and otherwise normal right breast.  No nipple retraction Heart: regular rate and rhythm, S1: decreased intensity and S2: increased intensity Abdomen: soft, non-tender; bowel sounds normal; no  masses,  no organomegaly Pelvic: cervix normal in appearance, external genitalia normal, no adnexal masses or tenderness, no cervical motion tenderness, uterus normal size, shape, and consistency and vagina atrophic Extremities: extremities normal, atraumatic, no cyanosis or edema Pulses: 2+ and symmetric Skin: Skin color, texture, turgor normal. No rashes or lesions Lymph nodes: Cervical, supraclavicular, and axillary nodes normal. Neurologic: Grossly normal    Assessment:    Healthy female exam. Elevated BP today but WNL at last visit with PCP 2 wks ago. Breast abnormality for biopsy next week.      Plan:      Problem List Items Addressed This Visit     Unprioritized   Allergic rhinitis - Primary   GERD (gastroesophageal reflux disease)   Hypothyroid   Arthritis   Family history of breast cancer    Other Visit Diagnoses   Screening for malignant neoplasm of the cervix        Relevant Orders       Cytology - PAP    Routine gynecological examination           See After Visit Summary for Counseling Recommendations

## 2014-05-09 LAB — CYTOLOGY - PAP

## 2014-05-15 ENCOUNTER — Other Ambulatory Visit: Payer: Self-pay | Admitting: Internal Medicine

## 2014-05-15 DIAGNOSIS — N6310 Unspecified lump in the right breast, unspecified quadrant: Secondary | ICD-10-CM

## 2014-05-16 ENCOUNTER — Other Ambulatory Visit: Payer: Self-pay

## 2014-05-16 ENCOUNTER — Other Ambulatory Visit: Payer: Self-pay | Admitting: Internal Medicine

## 2014-05-16 DIAGNOSIS — N6310 Unspecified lump in the right breast, unspecified quadrant: Secondary | ICD-10-CM

## 2014-05-17 ENCOUNTER — Ambulatory Visit
Admission: RE | Admit: 2014-05-17 | Discharge: 2014-05-17 | Disposition: A | Discharge status: Home / Self Care | Payer: BC Managed Care – PPO | Source: Ambulatory Visit | Attending: Internal Medicine | Admitting: Internal Medicine

## 2014-05-17 ENCOUNTER — Other Ambulatory Visit: Payer: Self-pay | Admitting: Internal Medicine

## 2014-05-17 DIAGNOSIS — N6310 Unspecified lump in the right breast, unspecified quadrant: Secondary | ICD-10-CM

## 2014-05-17 DIAGNOSIS — N6320 Unspecified lump in the left breast, unspecified quadrant: Secondary | ICD-10-CM

## 2014-05-18 ENCOUNTER — Other Ambulatory Visit: Payer: Self-pay | Admitting: Internal Medicine

## 2014-05-18 DIAGNOSIS — D0512 Intraductal carcinoma in situ of left breast: Secondary | ICD-10-CM

## 2014-05-21 ENCOUNTER — Encounter: Payer: Self-pay | Admitting: *Deleted

## 2014-05-21 ENCOUNTER — Telehealth: Payer: Self-pay | Admitting: *Deleted

## 2014-05-21 DIAGNOSIS — C50512 Malignant neoplasm of lower-outer quadrant of left female breast: Secondary | ICD-10-CM | POA: Insufficient documentation

## 2014-05-21 NOTE — Telephone Encounter (Signed)
Left message to schedule patient for [facility] 05/23/14.  Awaiting patient response.

## 2014-05-21 NOTE — Telephone Encounter (Signed)
Received call back from patient and confirmed her [facility] appointment for 05/23/14 at 830am.  Patient would like to cancel her MRI due to costs.  She states her out of pocket is $2400.  Informed her I would cancel this and she can discuss with the physicians on 05/23/14.

## 2014-05-22 ENCOUNTER — Other Ambulatory Visit: Payer: BC Managed Care – PPO

## 2014-05-23 ENCOUNTER — Encounter: Payer: Self-pay | Admitting: *Deleted

## 2014-05-23 ENCOUNTER — Ambulatory Visit: Payer: BC Managed Care – PPO | Attending: Surgery | Admitting: Physical Therapy

## 2014-05-23 ENCOUNTER — Encounter: Payer: Self-pay | Admitting: Dietician

## 2014-05-23 ENCOUNTER — Other Ambulatory Visit (INDEPENDENT_AMBULATORY_CARE_PROVIDER_SITE_OTHER): Payer: Self-pay | Admitting: Surgery

## 2014-05-23 ENCOUNTER — Ambulatory Visit
Admission: RE | Admit: 2014-05-23 | Discharge: 2014-05-23 | Disposition: A | Discharge status: Home / Self Care | Payer: BC Managed Care – PPO | Source: Ambulatory Visit | Attending: Radiation Oncology | Admitting: Radiation Oncology

## 2014-05-23 ENCOUNTER — Ambulatory Visit (HOSPITAL_BASED_OUTPATIENT_CLINIC_OR_DEPARTMENT_OTHER): Payer: BC Managed Care – PPO | Admitting: Surgery

## 2014-05-23 VITALS — BP 132/72 | HR 93 | Temp 98.2°F | Ht 62.5 in | Wt 215.0 lb

## 2014-05-23 DIAGNOSIS — IMO0001 Reserved for inherently not codable concepts without codable children: Secondary | ICD-10-CM | POA: Diagnosis present

## 2014-05-23 DIAGNOSIS — R293 Abnormal posture: Secondary | ICD-10-CM | POA: Insufficient documentation

## 2014-05-23 DIAGNOSIS — C50512 Malignant neoplasm of lower-outer quadrant of left female breast: Secondary | ICD-10-CM

## 2014-05-23 DIAGNOSIS — C50912 Malignant neoplasm of unspecified site of left female breast: Secondary | ICD-10-CM

## 2014-05-23 DIAGNOSIS — C50519 Malignant neoplasm of lower-outer quadrant of unspecified female breast: Secondary | ICD-10-CM

## 2014-05-23 DIAGNOSIS — C50919 Malignant neoplasm of unspecified site of unspecified female breast: Secondary | ICD-10-CM | POA: Diagnosis not present

## 2014-05-23 NOTE — Progress Notes (Signed)
[place] Psychosocial Distress Screening Clinical Social Work  Patient completed distress screening protocol, and scored a 3 on the Psychosocial Distress Thermometer which indicates mild distress. Clinical Social Worker met with pt in [facility] to assess for distress and other psychosocial needs.  Pt stated she was doing "well" and felt comfortable with her treatment plan.  CSW provided pt with information on the support team and support programs at [facility], and pt was agreeable to an alight guide referral.  CSW encouraged pt to call with and additional questions or concerns.       Clinical Social Worker follow up needed: Yes.    If yes, follow up plan: Alight Guide Referral    [facility] DISTRESS SCREENING 05/23/2014  Screening Type Initial Screening  Mark the number that describes how much distress you have been experiencing in the past week 3  Practical problem type Insurance;Work/school  Information Concerns Type Lack of info about treatment  Physical Problem type Pain;Getting around;Bathing/dressing  Physician notified of physical symptoms Yes  Referral to clinical psychology No  Referral to clinical social work No  Referral to dietition No  Referral to financial advocate No  Referral to support programs No  Referral to palliative care No   Johnnye Lana, MSW, [place] [phone]

## 2014-05-23 NOTE — Progress Notes (Addendum)
Re:   Jill Gonzalez DOB:   06-14-1956 MRN:   [id number]  [facility]  ASSESSMENT AND PLAN: 1.  Left breast cancer, 3:30 o'clock, Tis, N0  Biopsy 05/17/2014 - [facility] [id number]) - left breast - at least DCIS involving papillary neoplams, ER - 100%, PR - 98%, lymph node biopsy - negative  Oncology - Dr. Pablo Ledger (no med onc)  I discussed the options for breast cancer treatment with the patient. She is in the [facility] [place] and understands the treatment of breast cancer includes medical oncology and radiation oncology.  I discussed the surgical options of lumpectomy vs. mastectomy.  If mastectomy, there is the possibility of reconstruction.   I discussed the options of lymph node biopsy.  The treatment plan depends on the pathologic staging of the tumor and the patient's personal wishes.  The risks of surgery include, but are not limited to, bleeding, infection, the need for further surgery, and nerve injury.  The patient has been given literature on the treatment of breast cancer.  Plan:  1) left breast lumpectomy (seed localization) and left axillary SLNBx (because of mass on mammogram)  [Oncotype score - 0, 3% recurrence rate  DN 06/29/2014]  2.  Benign biopsy of right breast 3.  Benign left axillary lymph node biopsy 4.  Hypothyroid - she just started thyroid replacement. 5.  Arthritis left shoulder and left knee  No chief complaint on file.  REFERRING PHYSICIAN: Webb Silversmith, NP  HISTORY OF PRESENT ILLNESS: Jill Gonzalez is a 58 y.o. (DOB: 12/20/1955)  AA F  female whose primary care physician is Webb Silversmith, NP and comes to [facility] [place] today for new left breast cancer. Husband with the patient.  Her last menstrual period was 2007 (she remembers this because she moved to [place] that year).  She is on no hormones.  Her mother had breast cancer at age 43 (in 46?).  Her mother had a mastectomy and is still living.  She had a mammogram at [place] - 05/15/2014 - 1. 1.6 cm  complex cystic mass in the 3:30 o'clock position of the left breast. 2. 6 mm group of probably benign microcalcifications in the left breast, adjacent to the complex cystic mass possibly representing calcifications associated with fat necrosis. If the cyst aspiration/biopsy of the left breast mass is negative for malignancy, a followup left diagnostic mammogram would be  recommended in 6 months. 3. Normal right breast intramammary lymph node.  She does not have a MRI - cost an issue.  We talked about the MRI and its benefits and risks.  I told her it could change the treatment recommendations, but it is not an absolute requirment.  Biopsy 05/17/2014 - [facility] [id number]) - left breast - at least DCIS involving papillary neoplams, ER - 100%, PR - 98%, lymph node biopsy - negative   Past Medical History  Diagnosis Date  . Allergy   . Arthritis   . Blood transfusion without reported diagnosis 1977  . Phlebitis   . GERD (gastroesophageal reflux disease)       Past Surgical History  Procedure Laterality Date  . Cesarean section  1981  . Breast biopsy Bilateral 2011, 2012  . Tubal ligation Right 1972  . Tonsillectomy    . Cervical cone biopsy  1992      Current Outpatient Prescriptions  Medication Sig Dispense Refill  . COD LIVER OIL PO Take 2 capsules by mouth daily.      . Flaxseed, Linseed, (FLAX SEED OIL  PO) Take 2 capsules by mouth daily.      . fluticasone (FLONASE) 50 MCG/ACT nasal spray Place into the nose.      Marland Kitchen ipratropium (ATROVENT) 0.06 % nasal spray Place 2 sprays into both nostrils 4 (four) times daily.      Marland Kitchen levocetirizine (XYZAL) 5 MG tablet Take by mouth.      . levothyroxine (SYNTHROID, LEVOTHROID) 25 MCG tablet Take 1 tablet (25 mcg total) by mouth daily before breakfast.  30 tablet  1  . NON FORMULARY Take 2 capsules by mouth daily. 24/7 Solution--Sugar control       No current facility-administered medications for this visit.      Allergies  Allergen Reactions    . Sulfa Antibiotics     REVIEW OF SYSTEMS: Skin:  No history of rash.  No history of abnormal moles. Infection:  No history of hepatitis or HIV.  No history of MRSA. Neurologic:  No history of stroke.  No history of seizure.  No history of headaches. Cardiac:  No history of hypertension. No history of heart disease.  No history of prior cardiac catheterization.  No history of seeing a cardiologist. Pulmonary:  Does not smoke cigarettes.  No asthma or bronchitis.  No OSA/CPAP.  Endocrine:  No diabetes. Just started thyroid replacement. Gastrointestinal:  No history of stomach disease.  No history of liver disease.  No history of gall bladder disease.  No history of pancreas disease.  She is scheduled for colonoscopy in August, 2015 - no sure of doctor's name. Urologic:  No history of kidney stones.  No history of bladder infections. Musculoskeletal:  Arthritis of left shoulder and left knee. Hematologic:  No bleeding disorder.  No history of anemia.  Not anticoagulated. Psycho-social:  The patient is oriented.   The patient has no obvious psychologic or social impairment to understanding our conversation and plan.  SOCIAL and FAMILY HISTORY: Married. Her husband is with her. 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 She was living in [place] until 2013 - but she moved here to be near her home.  PHYSICAL EXAM: There were no vitals taken for this visit.  General: AA F who is alert and generally healthy appearing.  HEENT: Normal. Pupils equal. Neck: Supple. No mass.  No thyroid mass. Lymph Nodes:  No supraclavicular or cervical nodes. Lungs: Clear to auscultation and symmetric breath sounds. Heart:  RRR. No murmur or rub. Breasts:  Right: Bruise at 3 o'clock, No mass  Left - Bruise at 4 o'clock and in left axilla, no mass.  Abdomen: Soft. No mass. No tenderness. No hernia. Normal bowel sounds.  Scars from C section. Rectal: Not done. Extremities:  Good  strength and ROM  in upper and lower extremities. Neurologic:  Grossly intact to motor and sensory function. Psychiatric: Has normal mood and affect. Behavior is normal.   DATA REVIEWED: Epic  Alphonsa Overall, MD,  [facility] Surgery, [address]  [place], [place]    [place] Phone:  [place]:  [phone]

## 2014-05-23 NOTE — Progress Notes (Signed)
Radiation Oncology         [phone]) [phone] ________________________________  Initial outpatient Consultation - Date: 05/23/2014   Name: Jill Gonzalez MRN: [id number]   DOB: 03-Nov-1956  REFERRING PHYSICIAN: Kandis Cocking, MD  STAGE: Breast cancer of lower-outer quadrant of left female breast   Primary site: Breast (Left)   Staging method: AJCC 7th Edition   Clinical: Stage 0 (Tis (DCIS), N0, cM0)   Summary: Stage 0 (Tis (DCIS), N0, cM0)   Clinical comments: Staged at breast conference 05/23/14  HISTORY OF PRESENT ILLNESS::Jill Gonzalez is a 58 y.o. female  who was found to have a mass in the left breast on screening mammography. This was associated with calcifications and measured about a centimeter. The mass was also detected in the right breast which was biopsied and found to be benign. A left axillary lymph node was noted on ultrasound have cortical thickening. A biopsy of this was negative. The mass in the left breast was biopsied and shown to be DCIS which was low grade ER positive at 100% PR positive at 98%. It was felt this likely represented a papillary lesion with associated DCIS. She presents to clinic today accompanied by her husband. She has some soreness after her biopsy. She is GX P2 with her first live birth at 22. She has undergone menopause. She's unsure when that happens. She has not been on hormone replacement therapy. She had her first menstrual period at 35.Marland Kitchen  PREVIOUS RADIATION THERAPY: No  PAST MEDICAL HISTORY:  has a past medical history of Allergy; Arthritis; Blood transfusion without reported diagnosis (1977); Phlebitis; GERD (gastroesophageal reflux disease); Bronchitis; Anemia; Thyroid disease; and Hot flashes.    PAST SURGICAL HISTORY: Past Surgical History  Procedure Laterality Date  . Cesarean section  1981  . Breast biopsy Bilateral 2011, 2012  . Tubal ligation Right 1972  . Tonsillectomy    . Cervical cone biopsy  1992    FAMILY HISTORY:  Family  History  Problem Relation Age of Onset  . Arthritis Mother   . Cancer Mother     Breast  . Hyperlipidemia Mother   . Heart disease Mother   . Hypertension Mother   . Stroke Brother   . Diabetes Brother   . Hypertension Sister   . Stomach cancer Father     SOCIAL HISTORY:  History  Substance Use Topics  . Smoking status: Never Smoker   . Smokeless tobacco: Never Used  . Alcohol Use: No   she works as a Diplomatic Services operational officer at [employer]   ALLERGIES: Sulfa antibiotics  MEDICATIONS:  Current Outpatient Prescriptions  Medication Sig Dispense Refill  . COD LIVER OIL PO Take 2 capsules by mouth daily.      . Flaxseed, Linseed, (FLAX SEED OIL PO) Take 2 capsules by mouth daily.      . fluticasone (FLONASE) 50 MCG/ACT nasal spray Place into the nose.      Marland Kitchen ipratropium (ATROVENT) 0.06 % nasal spray Place 2 sprays into both nostrils 4 (four) times daily.      Marland Kitchen levocetirizine (XYZAL) 5 MG tablet Take by mouth.      . levothyroxine (SYNTHROID, LEVOTHROID) 25 MCG tablet Take 1 tablet (25 mcg total) by mouth daily before breakfast.  30 tablet  1  . NON FORMULARY Take 2 capsules by mouth daily. 24/7 Solution--Sugar control       No current facility-administered medications for this encounter.    REVIEW OF SYSTEMS:  A 15 point review of systems is  documented in the electronic medical record. This was obtained by the nursing staff. However, I reviewed this with the patient to discuss relevant findings and make appropriate changes.  Pertinent items are noted in HPI.  PHYSICAL EXAM:  Filed Vitals:   05/23/14 0900  BP: 132/72  Pulse: 93  Temp: 98.2 F (36.8 C)  .215 lb (97.523 kg). Pleasant female in no distress sitting comfortably on examining table. She has some bruising in the right breast with no appreciable masses. In the left breast she has a bruise in the lower outer quadrant and in the left axilla with no masses palpable. She is alert and oriented x3.  LABORATORY DATA:  Lab Results   Component Value Date   WBC 5.2 04/25/2014   HGB 11.9* 04/25/2014   HCT 35.7* 04/25/2014   MCV 94.6 04/25/2014   PLT 308.0 04/25/2014   Lab Results  Component Value Date   NA 140 04/25/2014   K 3.2* 04/25/2014   CL 101 04/25/2014   CO2 34* 04/25/2014   Lab Results  Component Value Date   ALT 20 04/25/2014   AST 24 04/25/2014   ALKPHOS 62 04/25/2014   BILITOT 0.2 04/25/2014     RADIOGRAPHY: Mm Digital Diagnostic Unilat L  05/17/2014   CLINICAL DATA:  A 58 year old female who is status post ultrasound-guided core needle biopsy of each breast and the left axilla.  EXAM: DIAGNOSTIC BILATERAL MAMMOGRAM POST ULTRASOUND BIOPSY  COMPARISON:  Previous exams  FINDINGS: Mammographic images were obtained following ultrasound guided biopsy of a complex cystic mass in the left breast, a left axillary lymph node, and a right breast mass.  There is satisfactory positioning of the coil metal tissue marker in the lower outer left breast in the epicenter of the biopsied mass and minimal post biopsy change. Post biopsy changes are also partially demonstrated in the left axilla.  There is satisfactory positioning of a wing metal tissue marker in the upper outer right breast at site of biopsy. The corresponding right breast mass noted on mammography has resolved following biopsy.  IMPRESSION: Satisfactory positioning of the metal tissue markers in each breast following ultrasound-guided core needle biopsy.  Final Assessment: Post Procedure Mammograms for Marker Placement   Electronically Signed   By: Andres Shad   On: 05/17/2014 11:10   Mm Digital Diagnostic Unilat L  05/15/2014   ADDENDUM REPORT: 05/15/2014 13:13  ADDENDUM: Previous mammogram, ultrasound and biopsy examinations from the [facility], in [place], [place], have been obtained for comparison. These include examinations dated 02/27/2010, 10/08/2011 and 11/18/2011.  The currently demonstrated rounded mass in the 3:30 o'clock position of the left  breast has increased significantly in size since 11/18/2011 with increased associated calcifications posteriorly. The patient was initially scheduled for a stereotactic guided core needle biopsy of this mass in 2013, which the patient canceled. This appeared solid on the previous ultrasound dated 10/08/2011 and measured 1.3 x 0.8 x 0.7 cm in maximum dimensions at that time. The fact that this previously appeared solid and has increased significantly in size makes this more suspicious for malignancy. This mass is scheduled for ultrasound-guided core needle biopsy on 05/17/2014.  The currently demonstrated 10 mm intramammary lymph node in the 11 o'clock position of the right breast was not present on the most recent available mammogram of the right breast, dated 10/08/2011. Therefore, ultrasound-guided core needle biopsy of this probable lymph node is recommended to evaluate for the possibility of a neoplastic process involving the node. This will  be discussed with the patient and scheduled when she can be reached. I was unable to reach her by telephone at this time. Otherwise, the impression and recommendation are unchanged.  The BI-RADS category remains as follows:  4: Suspicious.   Electronically Signed   By: Enrique Sack M.D.   On: 05/15/2014 13:13   05/15/2014   CLINICAL DATA:  Left breast calcifications and bilateral breast masses at recent screening mammography. The patient has requested previous examinations from [place] for comparison. Those are not available at this time. A repeat attempt attempt to obtain those images is being made.  EXAM: DIGITAL DIAGNOSTIC  LEFT MAMMOGRAM WITH CAD  ULTRASOUND BILATERAL BREAST  COMPARISON:  Screening mammogram with 3D digital breast tomosynthesis dated 04/27/2014.  ACR Breast Density Category b: There are scattered areas of fibroglandular density.  FINDINGS: Spot magnification views of the left breast demonstrate a 6 x 2 x 2 mm group of microcalcifications at the posterior,  inferior aspect of a rounded, circumscribed mass in the lower outer quadrant of the breast. The calcifications are predominantly coarse and oval in configuration with some arranged in a linear fashion. There is a suggestion of a surrounding thin rim of partial calcification with central fat density. There are 2 adjacent similar appearing calcifications slightly more medially.  Mammographic images were processed with CAD.  On physical exam, no mass is palpable in the upper right breast. There is an approximately 1.5 cm rounded palpable mass in the 3:30 o'clock position of the left breast, 7 cm from the nipple. No palpable left axillary lymph nodes.  Ultrasound is performed, showing a normal appearing intramammary lymph node in the 11 o'clock position of the right breast, 15 cm from the nipple, corresponding to the mammographic mass. This measures 10 x 4 x 3 mm.  In the 3:30 o'clock position of the left breast, 7 cm from the nipple, a 1.6 x 1.5 x 1.5 cm rounded, circumscribed, solid and cystic appearing mass is demonstrated exiting posterior acoustical shadowing. Initially, this appeared to have internal blood flow with color Doppler. However, no internal blood flow was confirmed with color Doppler on subsequent imaging.  Ultrasound of the left axilla demonstrated 2 normal sized lymph nodes, one with diffuse cortical thickening. The maximum cortical thickness involving one of the nodes is 5 mm and the maximum cortical thickness involving the other node is 2 mm.  IMPRESSION: 1. 1.6 cm complex cystic mass in the 3:30 o'clock position of the left breast. 2. 6 mm group of probably benign microcalcifications in the left breast, adjacent to the complex cystic mass possibly representing calcifications associated with fat necrosis. If the cyst aspiration/biopsy of the left breast mass is negative for malignancy, a followup left diagnostic mammogram would be recommended in 6 months. 3. Normal right breast intramammary lymph  node.  RECOMMENDATION: Left breast cyst aspiration/core needle biopsy and left axillary lymph node core needle biopsy. These have been scheduled for 8 a.m. on 05/17/2014.  I have discussed the findings and recommendations with the patient. Results were also provided in writing at the conclusion of the visit. If applicable, a reminder letter will be sent to the patient regarding the next appointment.  BI-RADS CATEGORY  4: Suspicious.  Electronically Signed: By: Enrique Sack M.D. On: 05/07/2014 16:09   Mm Digital Diagnostic Unilat R  05/17/2014   CLINICAL DATA:  A 58 year old female who is status post ultrasound-guided core needle biopsy of each breast and the left axilla.  EXAM: DIAGNOSTIC BILATERAL MAMMOGRAM  POST ULTRASOUND BIOPSY  COMPARISON:  Previous exams  FINDINGS: Mammographic images were obtained following ultrasound guided biopsy of a complex cystic mass in the left breast, a left axillary lymph node, and a right breast mass.  There is satisfactory positioning of the coil metal tissue marker in the lower outer left breast in the epicenter of the biopsied mass and minimal post biopsy change. Post biopsy changes are also partially demonstrated in the left axilla.  There is satisfactory positioning of a wing metal tissue marker in the upper outer right breast at site of biopsy. The corresponding right breast mass noted on mammography has resolved following biopsy.  IMPRESSION: Satisfactory positioning of the metal tissue markers in each breast following ultrasound-guided core needle biopsy.  Final Assessment: Post Procedure Mammograms for Marker Placement   Electronically Signed   By: Andres Shad   On: 05/17/2014 11:10   US Breast [place] Axilla  05/15/2014   ADDENDUM REPORT: 05/15/2014 13:13  ADDENDUM: Previous mammogram, ultrasound and biopsy examinations from the [facility], in [place], [place], have been obtained for comparison. These include examinations dated 02/27/2010,  10/08/2011 and 11/18/2011.  The currently demonstrated rounded mass in the 3:30 o'clock position of the left breast has increased significantly in size since 11/18/2011 with increased associated calcifications posteriorly. The patient was initially scheduled for a stereotactic guided core needle biopsy of this mass in 2013, which the patient canceled. This appeared solid on the previous ultrasound dated 10/08/2011 and measured 1.3 x 0.8 x 0.7 cm in maximum dimensions at that time. The fact that this previously appeared solid and has increased significantly in size makes this more suspicious for malignancy. This mass is scheduled for ultrasound-guided core needle biopsy on 05/17/2014.  The currently demonstrated 10 mm intramammary lymph node in the 11 o'clock position of the right breast was not present on the most recent available mammogram of the right breast, dated 10/08/2011. Therefore, ultrasound-guided core needle biopsy of this probable lymph node is recommended to evaluate for the possibility of a neoplastic process involving the node. This will be discussed with the patient and scheduled when she can be reached. I was unable to reach her by telephone at this time. Otherwise, the impression and recommendation are unchanged.  The BI-RADS category remains as follows:  4: Suspicious.   Electronically Signed   By: Enrique Sack M.D.   On: 05/15/2014 13:13   05/15/2014   CLINICAL DATA:  Left breast calcifications and bilateral breast masses at recent screening mammography. The patient has requested previous examinations from [place] for comparison. Those are not available at this time. A repeat attempt attempt to obtain those images is being made.  EXAM: DIGITAL DIAGNOSTIC  LEFT MAMMOGRAM WITH CAD  ULTRASOUND BILATERAL BREAST  COMPARISON:  Screening mammogram with 3D digital breast tomosynthesis dated 04/27/2014.  ACR Breast Density Category b: There are scattered areas of fibroglandular density.  FINDINGS: Spot  magnification views of the left breast demonstrate a 6 x 2 x 2 mm group of microcalcifications at the posterior, inferior aspect of a rounded, circumscribed mass in the lower outer quadrant of the breast. The calcifications are predominantly coarse and oval in configuration with some arranged in a linear fashion. There is a suggestion of a surrounding thin rim of partial calcification with central fat density. There are 2 adjacent similar appearing calcifications slightly more medially.  Mammographic images were processed with CAD.  On physical exam, no mass is palpable in the upper right breast.  There is an approximately 1.5 cm rounded palpable mass in the 3:30 o'clock position of the left breast, 7 cm from the nipple. No palpable left axillary lymph nodes.  Ultrasound is performed, showing a normal appearing intramammary lymph node in the 11 o'clock position of the right breast, 15 cm from the nipple, corresponding to the mammographic mass. This measures 10 x 4 x 3 mm.  In the 3:30 o'clock position of the left breast, 7 cm from the nipple, a 1.6 x 1.5 x 1.5 cm rounded, circumscribed, solid and cystic appearing mass is demonstrated exiting posterior acoustical shadowing. Initially, this appeared to have internal blood flow with color Doppler. However, no internal blood flow was confirmed with color Doppler on subsequent imaging.  Ultrasound of the left axilla demonstrated 2 normal sized lymph nodes, one with diffuse cortical thickening. The maximum cortical thickness involving one of the nodes is 5 mm and the maximum cortical thickness involving the other node is 2 mm.  IMPRESSION: 1. 1.6 cm complex cystic mass in the 3:30 o'clock position of the left breast. 2. 6 mm group of probably benign microcalcifications in the left breast, adjacent to the complex cystic mass possibly representing calcifications associated with fat necrosis. If the cyst aspiration/biopsy of the left breast mass is negative for malignancy, a  followup left diagnostic mammogram would be recommended in 6 months. 3. Normal right breast intramammary lymph node.  RECOMMENDATION: Left breast cyst aspiration/core needle biopsy and left axillary lymph node core needle biopsy. These have been scheduled for 8 a.m. on 05/17/2014.  I have discussed the findings and recommendations with the patient. Results were also provided in writing at the conclusion of the visit. If applicable, a reminder letter will be sent to the patient regarding the next appointment.  BI-RADS CATEGORY  4: Suspicious.  Electronically Signed: By: Enrique Sack M.D. On: 05/07/2014 16:09   US Breast Ltd Uni Right Inc Axilla  05/15/2014   ADDENDUM REPORT: 05/15/2014 13:13  ADDENDUM: Previous mammogram, ultrasound and biopsy examinations from the [facility], in [place], [place], have been obtained for comparison. These include examinations dated 02/27/2010, 10/08/2011 and 11/18/2011.  The currently demonstrated rounded mass in the 3:30 o'clock position of the left breast has increased significantly in size since 11/18/2011 with increased associated calcifications posteriorly. The patient was initially scheduled for a stereotactic guided core needle biopsy of this mass in 2013, which the patient canceled. This appeared solid on the previous ultrasound dated 10/08/2011 and measured 1.3 x 0.8 x 0.7 cm in maximum dimensions at that time. The fact that this previously appeared solid and has increased significantly in size makes this more suspicious for malignancy. This mass is scheduled for ultrasound-guided core needle biopsy on 05/17/2014.  The currently demonstrated 10 mm intramammary lymph node in the 11 o'clock position of the right breast was not present on the most recent available mammogram of the right breast, dated 10/08/2011. Therefore, ultrasound-guided core needle biopsy of this probable lymph node is recommended to evaluate for the possibility of a neoplastic process  involving the node. This will be discussed with the patient and scheduled when she can be reached. I was unable to reach her by telephone at this time. Otherwise, the impression and recommendation are unchanged.  The BI-RADS category remains as follows:  4: Suspicious.   Electronically Signed   By: Enrique Sack M.D.   On: 05/15/2014 13:13   05/15/2014   CLINICAL DATA:  Left breast calcifications and bilateral breast masses at  recent screening mammography. The patient has requested previous examinations from [place] for comparison. Those are not available at this time. A repeat attempt attempt to obtain those images is being made.  EXAM: DIGITAL DIAGNOSTIC  LEFT MAMMOGRAM WITH CAD  ULTRASOUND BILATERAL BREAST  COMPARISON:  Screening mammogram with 3D digital breast tomosynthesis dated 04/27/2014.  ACR Breast Density Category b: There are scattered areas of fibroglandular density.  FINDINGS: Spot magnification views of the left breast demonstrate a 6 x 2 x 2 mm group of microcalcifications at the posterior, inferior aspect of a rounded, circumscribed mass in the lower outer quadrant of the breast. The calcifications are predominantly coarse and oval in configuration with some arranged in a linear fashion. There is a suggestion of a surrounding thin rim of partial calcification with central fat density. There are 2 adjacent similar appearing calcifications slightly more medially.  Mammographic images were processed with CAD.  On physical exam, no mass is palpable in the upper right breast. There is an approximately 1.5 cm rounded palpable mass in the 3:30 o'clock position of the left breast, 7 cm from the nipple. No palpable left axillary lymph nodes.  Ultrasound is performed, showing a normal appearing intramammary lymph node in the 11 o'clock position of the right breast, 15 cm from the nipple, corresponding to the mammographic mass. This measures 10 x 4 x 3 mm.  In the 3:30 o'clock position of the left breast, 7 cm  from the nipple, a 1.6 x 1.5 x 1.5 cm rounded, circumscribed, solid and cystic appearing mass is demonstrated exiting posterior acoustical shadowing. Initially, this appeared to have internal blood flow with color Doppler. However, no internal blood flow was confirmed with color Doppler on subsequent imaging.  Ultrasound of the left axilla demonstrated 2 normal sized lymph nodes, one with diffuse cortical thickening. The maximum cortical thickness involving one of the nodes is 5 mm and the maximum cortical thickness involving the other node is 2 mm.  IMPRESSION: 1. 1.6 cm complex cystic mass in the 3:30 o'clock position of the left breast. 2. 6 mm group of probably benign microcalcifications in the left breast, adjacent to the complex cystic mass possibly representing calcifications associated with fat necrosis. If the cyst aspiration/biopsy of the left breast mass is negative for malignancy, a followup left diagnostic mammogram would be recommended in 6 months. 3. Normal right breast intramammary lymph node.  RECOMMENDATION: Left breast cyst aspiration/core needle biopsy and left axillary lymph node core needle biopsy. These have been scheduled for 8 a.m. on 05/17/2014.  I have discussed the findings and recommendations with the patient. Results were also provided in writing at the conclusion of the visit. If applicable, a reminder letter will be sent to the patient regarding the next appointment.  BI-RADS CATEGORY  4: Suspicious.  Electronically Signed: By: Enrique Sack M.D. On: 05/07/2014 16:09   Mm Screening Breast Tomo Bilateral  04/27/2014   CLINICAL DATA:  Screening.  EXAM: DIGITAL SCREENING BILATERAL MAMMOGRAM WITH 3D TOMO WITH CAD  COMPARISON:  Previous Exam(s)  ACR Breast Density Category b: There are scattered areas of fibroglandular density.  FINDINGS: In the left breast possible mass as well as microcalcifications warrant further imaging evaluation with magnification views of the microcalcifications  and ultrasound of the mass. In the right breast, possible mass warrants further imaging evaluation with ultrasound. Images were processed with CAD.  IMPRESSION: Further imaging evaluation is suggested for possible mass in an microcalcifications in the left breast.  Further imaging evaluation  is suggested for possible mass in the right breast.  RECOMMENDATION: Diagnostic mammogram left breast and ultrasound of both breasts. (Code:[id number]-B-[id number])  The patient will be contacted regarding the findings, and additional imaging will be scheduled.  BI-RADS CATEGORY  0: Incomplete. Need additional imaging evaluation and/or prior mammograms for comparison.   Electronically Signed   By: Marin Olp M.D.   On: 04/27/2014 17:34   [place] Lt Breast Bx W Loc Dev 1st Lesion Img Bx Spec US Guide  05/18/2014   ADDENDUM REPORT: 05/18/2014 14:46  ADDENDUM: PATHOLOGY:  Right 11 o'clock:  Benign fibrocystic changes.  Left 3:30 o'clock: At least DCIS involving a papillary neoplasm, on excision may be papillary cancer.  Left axillary lymph node:  Benign.  CONCORDANT:  Yes for all.  I discussed these results and the recommendations below with the patient by telephone on 05/18/2014 at 1345 hr. All of her questions were answered. She denies significant pain or bleeding at the biopsy site.  RECOMMENDATION: The patient has been scheduled for pre-treatment bilateral breast MRI at [facility] on [address] on Tuesday, July 14, at 7:15 a.m. She has been scheduled for the Multidisciplinary Breast Cancer Clinic at the [facility] on Wednesday, July 15, at 8:30 a.m.   Electronically Signed   By: Evangeline Dakin M.D.   On: 05/18/2014 14:46   05/18/2014   CLINICAL DATA:  58 year old female with a complex cystic mass in the left breast and abnormal left axillary node.  EXAM: ULTRASOUND GUIDED CORE NEEDLE BIOPSY OF A LEFT AXILLARY NODE  COMPARISON:  Previous exams.  FINDINGS: I met with the patient and we  discussed the procedure of ultrasound-guided biopsy, including benefits and alternatives. We discussed the high likelihood of a successful procedure. We discussed the risks of the procedure, including infection, bleeding, tissue injury, clip migration, and inadequate sampling. Informed written consent was given. The usual time-out protocol was performed immediately prior to the procedure.  Using sterile technique and 2% Lidocaine as local anesthetic, under direct ultrasound visualization, a 14 gauge spring-loaded device was used to perform biopsy of a left axillary node using a lateral approach. Follow up 2 view mammogram was performed and dictated separately.  IMPRESSION: Ultrasound guided biopsy of a left axillary node. No apparent complications.  Electronically Signed: By: Andres Shad On: 05/17/2014 11:11   [place] Lt Breast Bx W Loc Dev Ea Add Lesion Img Bx Spec US Guide  05/17/2014   CLINICAL DATA:  58 year old female with a complex cystic mass in the left breast  EXAM: ULTRASOUND GUIDED LEFT BREAST CORE NEEDLE BIOPSY  COMPARISON:  Previous exams.  PROCEDURE: I met with the patient and we discussed the procedure of ultrasound-guided biopsy, including benefits and alternatives. We discussed the high likelihood of a successful procedure. We discussed the risks of the procedure including infection, bleeding, tissue injury, clip migration, and inadequate sampling. Informed written consent was given. The usual time-out protocol was performed immediately prior to the procedure.  Using sterile technique and 2% Lidocaine as local anesthetic, under direct ultrasound visualization, a 12 gauge mechanicaldevice was used to perform biopsy of complex cystic mass using a lateral approach. At the conclusion of the procedure, a coil shaped tissue marker clip was deployed into the biopsied mass. Follow-up 2-view mammogram was performed and dictated separately.  IMPRESSION: Ultrasound-guided biopsy of left breast complex cystic  mass. No apparent complications.   Electronically Signed   By: Andres Shad   On: 05/17/2014 11:12  [place] Rt Breast Bx W Loc Dev 1st Lesion Img Bx Spec US Guide  05/17/2014   CLINICAL DATA:  A 58 year old female for ultrasound-guided core needle biopsy of the right breast.  EXAM: ULTRASOUND GUIDED RIGHT BREAST CORE NEEDLE BIOPSY  COMPARISON:  Previous exams.  PROCEDURE: I met with the patient and we discussed the procedure of ultrasound-guided biopsy, including benefits and alternatives. We discussed the high likelihood of a successful procedure. We discussed the risks of the procedure including infection, bleeding, tissue injury, clip migration, and inadequate sampling. Informed written consent was given. The usual time-out protocol was performed immediately prior to the procedure.  Using sterile technique and 2% Lidocaine as local anesthetic, under direct ultrasound visualization, a 12 gauge vacuum-assisteddevice was used to perform biopsy of a right breast mass using a lateral approach. After two passes under ultrasound guidance, the targeted mass completely dissipated. At the conclusion of the procedure, a wing shaped tissue marker clip was deployed into the biopsy cavity. Follow-up 2-view mammogram was performed and dictated separately.  IMPRESSION: Ultrasound-guided biopsy of a right breast mass. No apparent complications.   Electronically Signed   By: Andres Shad   On: 05/17/2014 11:11      IMPRESSION: Low-grade DCIS of the left breast  PLAN:I spoke to the patient today regarding her diagnosis and options for treatment. We discussed the equivalence in terms of survival and local failure between mastectomy and breast conservation. We discussed the role of radiation in decreasing local failures in patients who undergo lumpectomy. We discussed the process of simulation and the placement tattoos. We discussed 5-6 weeks of treatment as an outpatient. We discussed the possibility of asymptomatic lung damage  and the use of breath hold technique for cardiac sparing if necessary. We discussed the low likelihood of secondary malignancies. We discussed the possible side effects including but not limited to skin redness, fatigue, permanent skin darkening, and breast swelling.   I spent 60 minutes  face to face with the patient and more than 50% of that time was spent in counseling and/or coordination of care.   ------------------------------------------------  Thea Silversmith, MD

## 2014-05-23 NOTE — Progress Notes (Unsigned)
RD educated pt and pt's husband on 05/23/2014 at [facility]  Pt interested in learning about appropriate diet to follow while undergoing treatment. Diet recall indicate pt consuming oatmeal for breakfast, with the occasional intake of fast food breakfast sandwich. Lunch includes salad and/or sandwich and dinner consists of poultry dishes with vegetables.  Recommend pt increase intake of fruits and vegetables, comply with low fat diet, incorporate whole grains, and promoted plant based diet. Pt does not like beans or lentils, but was willing to consume more poultry and fish. Pt also noted reflux made it difficult to tolerate certain raw fruits/vegetables; reviewed ways to increase tolerance and how to meet goal of five servings/day.  Pt's husband apprehensive at first, as he enjoys higher fat dairy products and red meats, but by end of education, both were willing to modify diet to comply with lower fat and healthier fat alternatives.  RD reviewed nutrition education materials provided in [facility] folder. Discussed breast cancer nutrition "myths", organic vs non-organic and soy products.  Expect good compliance. Provided pt with outpatient oncology RD information and encouraged pt to contact with additional questions/concerns.  Atlee Abide MS RD LDN Clinical Dietitian BCWUG:[phone]

## 2014-05-24 ENCOUNTER — Other Ambulatory Visit (INDEPENDENT_AMBULATORY_CARE_PROVIDER_SITE_OTHER): Payer: Self-pay | Admitting: Surgery

## 2014-05-24 ENCOUNTER — Ambulatory Visit
Admission: RE | Admit: 2014-05-24 | Discharge: 2014-05-24 | Disposition: A | Discharge status: Home / Self Care | Payer: BC Managed Care – PPO | Source: Ambulatory Visit | Attending: Surgery | Admitting: Surgery

## 2014-05-24 DIAGNOSIS — C50912 Malignant neoplasm of unspecified site of left female breast: Secondary | ICD-10-CM

## 2014-05-25 ENCOUNTER — Encounter (HOSPITAL_BASED_OUTPATIENT_CLINIC_OR_DEPARTMENT_OTHER): Payer: Self-pay | Admitting: *Deleted

## 2014-05-25 NOTE — Progress Notes (Signed)
05/25/14 0949  OBSTRUCTIVE SLEEP APNEA  Have you ever been diagnosed with sleep apnea through a sleep study? No  Do you snore loudly (loud enough to be heard through closed doors)?  1  Do you often feel tired, fatigued, or sleepy during the daytime? 0  Has anyone observed you stop breathing during your sleep? 0  Do you have, or are you being treated for high blood pressure? 0  BMI more than 35 kg/m2? 1  Age over 59 years old? 1  Neck circumference greater than 40 cm/16 inches? 1  Gender: 0  Obstructive Sleep Apnea Score 4  Score 4 or greater  Results sent to PCP

## 2014-05-25 NOTE — Progress Notes (Signed)
No labs needed

## 2014-05-28 ENCOUNTER — Telehealth: Payer: Self-pay | Admitting: *Deleted

## 2014-05-28 NOTE — Telephone Encounter (Signed)
Spoke to patient briefly from [facility] 05/23/14.  Her phone was not getting good reception.  Lost call and called her back and left voicemail to return my call.

## 2014-05-29 ENCOUNTER — Encounter (HOSPITAL_BASED_OUTPATIENT_CLINIC_OR_DEPARTMENT_OTHER): Payer: Self-pay | Admitting: *Deleted

## 2014-05-29 ENCOUNTER — Ambulatory Visit
Admission: RE | Admit: 2014-05-29 | Discharge: 2014-05-29 | Disposition: A | Discharge status: Home / Self Care | Payer: BC Managed Care – PPO | Source: Ambulatory Visit | Attending: Surgery | Admitting: Surgery

## 2014-05-29 ENCOUNTER — Ambulatory Visit (HOSPITAL_BASED_OUTPATIENT_CLINIC_OR_DEPARTMENT_OTHER)
Admission: RE | Admit: 2014-05-29 | Discharge: 2014-05-29 | Disposition: A | Discharge status: Home / Self Care | Payer: BC Managed Care – PPO | Source: Ambulatory Visit | Attending: Surgery | Admitting: Surgery

## 2014-05-29 ENCOUNTER — Encounter (HOSPITAL_BASED_OUTPATIENT_CLINIC_OR_DEPARTMENT_OTHER): Payer: BC Managed Care – PPO | Admitting: Anesthesiology

## 2014-05-29 ENCOUNTER — Encounter (HOSPITAL_BASED_OUTPATIENT_CLINIC_OR_DEPARTMENT_OTHER)
Admission: RE | Disposition: A | Discharge status: Home / Self Care | Payer: Self-pay | Source: Ambulatory Visit | Attending: Surgery

## 2014-05-29 ENCOUNTER — Ambulatory Visit (HOSPITAL_BASED_OUTPATIENT_CLINIC_OR_DEPARTMENT_OTHER): Payer: BC Managed Care – PPO | Admitting: Anesthesiology

## 2014-05-29 ENCOUNTER — Encounter (HOSPITAL_COMMUNITY)
Admission: RE | Admit: 2014-05-29 | Discharge: 2014-05-29 | Disposition: A | Discharge status: Home / Self Care | Payer: BC Managed Care – PPO | Source: Ambulatory Visit | Attending: Surgery | Admitting: Surgery

## 2014-05-29 DIAGNOSIS — E039 Hypothyroidism, unspecified: Secondary | ICD-10-CM | POA: Diagnosis not present

## 2014-05-29 DIAGNOSIS — M19019 Primary osteoarthritis, unspecified shoulder: Secondary | ICD-10-CM | POA: Diagnosis not present

## 2014-05-29 DIAGNOSIS — C50912 Malignant neoplasm of unspecified site of left female breast: Secondary | ICD-10-CM

## 2014-05-29 DIAGNOSIS — C50919 Malignant neoplasm of unspecified site of unspecified female breast: Secondary | ICD-10-CM | POA: Diagnosis present

## 2014-05-29 DIAGNOSIS — Z882 Allergy status to sulfonamides status: Secondary | ICD-10-CM | POA: Insufficient documentation

## 2014-05-29 DIAGNOSIS — Z803 Family history of malignant neoplasm of breast: Secondary | ICD-10-CM | POA: Diagnosis not present

## 2014-05-29 DIAGNOSIS — IMO0002 Reserved for concepts with insufficient information to code with codable children: Secondary | ICD-10-CM

## 2014-05-29 DIAGNOSIS — C50512 Malignant neoplasm of lower-outer quadrant of left female breast: Secondary | ICD-10-CM

## 2014-05-29 DIAGNOSIS — C50519 Malignant neoplasm of lower-outer quadrant of unspecified female breast: Secondary | ICD-10-CM | POA: Diagnosis not present

## 2014-05-29 DIAGNOSIS — D0590 Unspecified type of carcinoma in situ of unspecified breast: Secondary | ICD-10-CM

## 2014-05-29 DIAGNOSIS — Z79899 Other long term (current) drug therapy: Secondary | ICD-10-CM | POA: Insufficient documentation

## 2014-05-29 DIAGNOSIS — K219 Gastro-esophageal reflux disease without esophagitis: Secondary | ICD-10-CM | POA: Diagnosis not present

## 2014-05-29 DIAGNOSIS — M1710 Unilateral primary osteoarthritis, unspecified knee: Secondary | ICD-10-CM | POA: Diagnosis not present

## 2014-05-29 HISTORY — DX: Other specified postprocedural states: R11.2

## 2014-05-29 HISTORY — DX: Other specified postprocedural states: Z98.890

## 2014-05-29 SURGERY — RADIOACTIVE SEED GUIDED PARTIAL MASTECTOMY WITH AXILLARY SENTINEL LYMPH NODE BIOPSY
Anesthesia: Regional | Site: Breast | Laterality: Left

## 2014-05-29 MED ORDER — ONDANSETRON HCL 4 MG/2ML IJ SOLN
INTRAMUSCULAR | Status: DC | PRN
  Administered 2014-05-29: 4 mg via INTRAVENOUS

## 2014-05-29 MED ORDER — PROPOFOL 10 MG/ML IV BOLUS
INTRAVENOUS | Status: DC | PRN
  Administered 2014-05-29: 200 mg via INTRAVENOUS

## 2014-05-29 MED ORDER — FENTANYL CITRATE 0.05 MG/ML IJ SOLN
INTRAMUSCULAR | Status: AC
  Filled 2014-05-29: qty 6

## 2014-05-29 MED ORDER — OXYCODONE HCL 5 MG PO TABS: 5.0000 mg | ORAL_TABLET | Freq: Once | ORAL | Status: DC | PRN

## 2014-05-29 MED ORDER — FENTANYL CITRATE 0.05 MG/ML IJ SOLN
50.0000 ug | INTRAMUSCULAR | Status: DC | PRN
  Administered 2014-05-29: 50 ug via INTRAVENOUS
  Administered 2014-05-29: 100 ug via INTRAVENOUS

## 2014-05-29 MED ORDER — HYDROCODONE-ACETAMINOPHEN 5-325 MG PO TABS: 1.0000 | ORAL_TABLET | Freq: Four times a day (QID) | ORAL | Status: DC | PRN

## 2014-05-29 MED ORDER — MIDAZOLAM HCL 2 MG/2ML IJ SOLN
1.0000 mg | INTRAMUSCULAR | Status: DC | PRN
  Administered 2014-05-29: 2 mg via INTRAVENOUS
  Administered 2014-05-29: 1 mg via INTRAVENOUS

## 2014-05-29 MED ORDER — DEXAMETHASONE SODIUM PHOSPHATE 4 MG/ML IJ SOLN
INTRAMUSCULAR | Status: DC | PRN
  Administered 2014-05-29: 10 mg via INTRAVENOUS

## 2014-05-29 MED ORDER — CHLORHEXIDINE GLUCONATE 4 % EX LIQD: 1.0000 "application " | Freq: Once | CUTANEOUS | Status: DC

## 2014-05-29 MED ORDER — MIDAZOLAM HCL 2 MG/2ML IJ SOLN
INTRAMUSCULAR | Status: AC
  Filled 2014-05-29: qty 2

## 2014-05-29 MED ORDER — TECHNETIUM TC 99M SULFUR COLLOID FILTERED
1.0000 | Freq: Once | INTRAVENOUS | Status: AC | PRN
  Administered 2014-05-29: 1 via INTRADERMAL

## 2014-05-29 MED ORDER — LIDOCAINE HCL (CARDIAC) 20 MG/ML IV SOLN
INTRAVENOUS | Status: DC | PRN
  Administered 2014-05-29: 75 mg via INTRAVENOUS

## 2014-05-29 MED ORDER — FENTANYL CITRATE 0.05 MG/ML IJ SOLN
INTRAMUSCULAR | Status: AC
  Filled 2014-05-29: qty 2

## 2014-05-29 MED ORDER — HYDROMORPHONE HCL PF 1 MG/ML IJ SOLN: 0.2500 mg | INTRAMUSCULAR | Status: DC | PRN

## 2014-05-29 MED ORDER — LACTATED RINGERS IV SOLN
INTRAVENOUS | Status: DC
  Administered 2014-05-29 (×2): via INTRAVENOUS

## 2014-05-29 MED ORDER — FENTANYL CITRATE 0.05 MG/ML IJ SOLN
INTRAMUSCULAR | Status: DC | PRN
  Administered 2014-05-29: 100 ug via INTRAVENOUS

## 2014-05-29 MED ORDER — BUPIVACAINE-EPINEPHRINE (PF) 0.5% -1:200000 IJ SOLN
INTRAMUSCULAR | Status: AC
  Filled 2014-05-29: qty 30

## 2014-05-29 MED ORDER — OXYCODONE HCL 5 MG/5ML PO SOLN
5.0000 mg | Freq: Once | ORAL | Status: DC | PRN
Start: 2014-05-29 — End: 2014-05-29

## 2014-05-29 MED ORDER — BUPIVACAINE HCL (PF) 0.25 % IJ SOLN
INTRAMUSCULAR | Status: AC
  Filled 2014-05-29: qty 30

## 2014-05-29 MED ORDER — CEFAZOLIN SODIUM-DEXTROSE 2-3 GM-% IV SOLR
2.0000 g | INTRAVENOUS | Status: AC
  Administered 2014-05-29: 2 g via INTRAVENOUS

## 2014-05-29 MED ORDER — CEFAZOLIN SODIUM-DEXTROSE 2-3 GM-% IV SOLR
INTRAVENOUS | Status: AC
  Filled 2014-05-29: qty 50

## 2014-05-29 MED ORDER — MIDAZOLAM HCL 5 MG/5ML IJ SOLN
INTRAMUSCULAR | Status: DC | PRN
  Administered 2014-05-29: 2 mg via INTRAVENOUS

## 2014-05-29 MED ORDER — ONDANSETRON HCL 4 MG/2ML IJ SOLN: 4.0000 mg | Freq: Four times a day (QID) | INTRAMUSCULAR | Status: DC | PRN

## 2014-05-29 MED ORDER — BUPIVACAINE-EPINEPHRINE (PF) 0.5% -1:200000 IJ SOLN
INTRAMUSCULAR | Status: DC | PRN
  Administered 2014-05-29: 25 mL via PERINEURAL

## 2014-05-29 MED ORDER — BUPIVACAINE HCL (PF) 0.25 % IJ SOLN
INTRAMUSCULAR | Status: DC | PRN
Start: 2014-05-29 — End: 2014-05-29
  Administered 2014-05-29: 20 mL

## 2014-05-29 SURGICAL SUPPLY — 61 items
APPLIER CLIP 9.375 MED OPEN (MISCELLANEOUS)
BENZOIN TINCTURE PRP APPL 2/3 (GAUZE/BANDAGES/DRESSINGS) IMPLANT
BINDER BREAST LRG (GAUZE/BANDAGES/DRESSINGS) IMPLANT
BINDER BREAST MEDIUM (GAUZE/BANDAGES/DRESSINGS) IMPLANT
BINDER BREAST XLRG (GAUZE/BANDAGES/DRESSINGS) IMPLANT
BINDER BREAST XXLRG (GAUZE/BANDAGES/DRESSINGS) ×3 IMPLANT
BLADE HEX COATED 2.75 (ELECTRODE) IMPLANT
BLADE SURG 10 STRL SS (BLADE) ×3 IMPLANT
BLADE SURG 15 STRL LF DISP TIS (BLADE) ×1 IMPLANT
BLADE SURG 15 STRL SS (BLADE) ×2
CANISTER SUC SOCK COL 7IN (MISCELLANEOUS) IMPLANT
CANISTER SUCT 1200ML W/VALVE (MISCELLANEOUS) ×3 IMPLANT
CHLORAPREP W/TINT 26ML (MISCELLANEOUS) ×3 IMPLANT
CLIP APPLIE 9.375 MED OPEN (MISCELLANEOUS) IMPLANT
CLIP TI WIDE RED SMALL 6 (CLIP) ×3 IMPLANT
CLOSURE WOUND 1/4X4 (GAUZE/BANDAGES/DRESSINGS)
COVER MAYO STAND STRL (DRAPES) ×3 IMPLANT
COVER PROBE W GEL 5X96 (DRAPES) ×3 IMPLANT
COVER TABLE BACK 60X90 (DRAPES) ×3 IMPLANT
DECANTER SPIKE VIAL GLASS SM (MISCELLANEOUS) IMPLANT
DERMABOND ADVANCED (GAUZE/BANDAGES/DRESSINGS) ×2
DERMABOND ADVANCED .7 DNX12 (GAUZE/BANDAGES/DRESSINGS) ×1 IMPLANT
DEVICE DUBIN W/COMP PLATE 8390 (MISCELLANEOUS) ×3 IMPLANT
DRAIN CHANNEL 19F RND (DRAIN) IMPLANT
DRAPE LAPAROSCOPIC ABDOMINAL (DRAPES) ×3 IMPLANT
DRAPE UTILITY XL STRL (DRAPES) ×3 IMPLANT
DRSG PAD ABDOMINAL 8X10 ST (GAUZE/BANDAGES/DRESSINGS) IMPLANT
ELECT COATED BLADE 2.86 ST (ELECTRODE) ×6 IMPLANT
ELECT REM PT RETURN 9FT ADLT (ELECTROSURGICAL) ×3
ELECTRODE REM PT RTRN 9FT ADLT (ELECTROSURGICAL) ×1 IMPLANT
GLOVE BIO SURGEON STRL SZ 6.5 (GLOVE) ×2 IMPLANT
GLOVE BIO SURGEONS STRL SZ 6.5 (GLOVE) ×1
GLOVE BIOGEL M 7.0 STRL (GLOVE) ×3 IMPLANT
GLOVE EXAM NITRILE PF MED BLUE (GLOVE) ×3 IMPLANT
GLOVE SURG SIGNA 7.5 PF LTX (GLOVE) ×6 IMPLANT
GLOVE SURG SS PI 7.0 STRL IVOR (GLOVE) ×3 IMPLANT
GOWN STRL REUS W/ TWL LRG LVL3 (GOWN DISPOSABLE) ×1 IMPLANT
GOWN STRL REUS W/ TWL XL LVL3 (GOWN DISPOSABLE) ×1 IMPLANT
GOWN STRL REUS W/TWL LRG LVL3 (GOWN DISPOSABLE) ×2
GOWN STRL REUS W/TWL XL LVL3 (GOWN DISPOSABLE) ×2
KIT MARKER MARGIN INK (KITS) ×3 IMPLANT
NDL SAFETY ECLIPSE 18X1.5 (NEEDLE) IMPLANT
NEEDLE HYPO 18GX1.5 SHARP (NEEDLE)
NEEDLE HYPO 25X1 1.5 SAFETY (NEEDLE) ×3 IMPLANT
NS IRRIG 1000ML POUR BTL (IV SOLUTION) ×6 IMPLANT
PACK BASIN DAY SURGERY FS (CUSTOM PROCEDURE TRAY) ×3 IMPLANT
PENCIL BUTTON HOLSTER BLD 10FT (ELECTRODE) ×3 IMPLANT
PIN SAFETY STERILE (MISCELLANEOUS) ×3 IMPLANT
SHEET MEDIUM DRAPE 40X70 STRL (DRAPES) ×3 IMPLANT
SLEEVE SCD COMPRESS KNEE MED (MISCELLANEOUS) ×3 IMPLANT
SPONGE GAUZE 4X4 12PLY STER LF (GAUZE/BANDAGES/DRESSINGS) IMPLANT
SPONGE LAP 18X18 X RAY DECT (DISPOSABLE) IMPLANT
STRIP CLOSURE SKIN 1/4X4 (GAUZE/BANDAGES/DRESSINGS) IMPLANT
SUT MON AB 5-0 PS2 18 (SUTURE) IMPLANT
SUT VICRYL 3-0 CR8 SH (SUTURE) ×3 IMPLANT
SYRINGE CONTROL L 12CC (SYRINGE) ×3 IMPLANT
TOWEL OR 17X24 6PK STRL BLUE (TOWEL DISPOSABLE) ×3 IMPLANT
TOWEL OR NON WOVEN STRL DISP B (DISPOSABLE) ×3 IMPLANT
TUBE CONNECTING 20'X1/4 (TUBING) ×1
TUBE CONNECTING 20X1/4 (TUBING) ×2 IMPLANT
YANKAUER SUCT BULB TIP NO VENT (SUCTIONS) ×3 IMPLANT

## 2014-05-29 NOTE — Interval H&P Note (Signed)
History and Physical Interval Note:  05/29/2014 12:09 PM  Jill Gonzalez  has presented today for surgery, with the diagnosis of left breast cancer  The various methods of treatment have been discussed with the patient and family.  Husband at bedside.  I localized the seed with the Neoprobe at 4 o'clock position.  After consideration of risks, benefits and other options for treatment, the patient has consented to  Procedure(s): LEFT BREAST LUMPECTOM RADIOACTIVE SEED GUIDED  AND LEFT AXILLARY SENTINEL LYMPH NODE BIOPSY (Left) as a surgical intervention .  The patient's history has been reviewed, patient examined, no change in status, stable for surgery.  I have reviewed the patient's chart and labs.  Questions were answered to the patient's satisfaction.     Jill Gonzalez H

## 2014-05-29 NOTE — H&P (View-Only) (Signed)
Re:   Jill Gonzalez DOB:   1956/03/31 MRN:   [id number]  [facility]  ASSESSMENT AND PLAN: 1.  Left breast cancer, 3:30 o'clock, Tis, N0  Biopsy 05/17/2014 - Path [phone]) - left breast - at least DCIS involving papillary neoplams, ER - 100%, PR - 98%, lymph node biopsy - negative  Oncology - Dr. Pablo Ledger (no med onc)  I discussed the options for breast cancer treatment with the patient. She is in the [facility] and understands the treatment of breast cancer includes medical oncology and radiation oncology.  I discussed the surgical options of lumpectomy vs. mastectomy.  If mastectomy, there is the possibility of reconstruction.   I discussed the options of lymph node biopsy.  The treatment plan depends on the pathologic staging of the tumor and the patient's personal wishes.  The risks of surgery include, but are not limited to, bleeding, infection, the need for further surgery, and nerve injury.  The patient has been given literature on the treatment of breast cancer.  Plan:  1) left breast lumpectomy (seed localization) and left axillary SLNBx (because of mass on mammogram)  2.  Benign biopsy of right breast 3.  Benign left axillary lymph node biopsy 4.  Hypothyroid - she just started thyroid replacement. 5.  Arthritis left shoulder and left knee  No chief complaint on file.  REFERRING PHYSICIAN: Webb Silversmith, NP  HISTORY OF PRESENT ILLNESS: Jill Gonzalez is a 58 y.o. (DOB: Jun 08, 1956)  AA F  female whose primary care physician is Webb Silversmith, NP and comes to [facility] today for new left breast cancer. Husband with the patient.  Her last menstrual period was 2007 (she remembers this because she moved to [place] that year).  She is on no hormones.  Her mother had breast cancer at age 83 (in 50?).  Her mother had a mastectomy and is still living.  She had a mammogram at [facility] - 05/15/2014 - 1. 1.6 cm complex cystic mass in the 3:30 o'clock position of the left  breast. 2. 6 mm group of probably benign microcalcifications in the left breast, adjacent to the complex cystic mass possibly representing calcifications associated with fat necrosis. If the cyst aspiration/biopsy of the left breast mass is negative for malignancy, a followup left diagnostic mammogram would be  recommended in 6 months. 3. Normal right breast intramammary lymph node.  She does not have a MRI - cost an issue.  We talked about the MRI and its benefits and risks.  I told her it could change the treatment recommendations, but it is not an absolute requirment.  Biopsy 05/17/2014 - Path [phone]) - left breast - at least DCIS involving papillary neoplams, ER - 100%, PR - 98%, lymph node biopsy - negative   Past Medical History  Diagnosis Date  . Allergy   . Arthritis   . Blood transfusion without reported diagnosis 1977  . Phlebitis   . GERD (gastroesophageal reflux disease)       Past Surgical History  Procedure Laterality Date  . Cesarean section  1981  . Breast biopsy Bilateral 2011, 2012  . Tubal ligation Right 1972  . Tonsillectomy    . Cervical cone biopsy  1992      Current Outpatient Prescriptions  Medication Sig Dispense Refill  . COD LIVER OIL PO Take 2 capsules by mouth daily.      . Flaxseed, Linseed, (FLAX SEED OIL PO) Take 2 capsules by mouth daily.      Marland Kitchen  fluticasone (FLONASE) 50 MCG/ACT nasal spray Place into the nose.      Marland Kitchen ipratropium (ATROVENT) 0.06 % nasal spray Place 2 sprays into both nostrils 4 (four) times daily.      Marland Kitchen levocetirizine (XYZAL) 5 MG tablet Take by mouth.      . levothyroxine (SYNTHROID, LEVOTHROID) 25 MCG tablet Take 1 tablet (25 mcg total) by mouth daily before breakfast.  30 tablet  1  . NON FORMULARY Take 2 capsules by mouth daily. 24/7 Solution--Sugar control       No current facility-administered medications for this visit.      Allergies  Allergen Reactions  . Sulfa Antibiotics     REVIEW OF SYSTEMS: Skin:  No  history of rash.  No history of abnormal moles. Infection:  No history of hepatitis or HIV.  No history of MRSA. Neurologic:  No history of stroke.  No history of seizure.  No history of headaches. Cardiac:  No history of hypertension. No history of heart disease.  No history of prior cardiac catheterization.  No history of seeing a cardiologist. Pulmonary:  Does not smoke cigarettes.  No asthma or bronchitis.  No OSA/CPAP.  Endocrine:  No diabetes. Just started thyroid replacement. Gastrointestinal:  No history of stomach disease.  No history of liver disease.  No history of gall bladder disease.  No history of pancreas disease.  She is scheduled for colonoscopy in August, 2015 - no sure of doctor's name. Urologic:  No history of kidney stones.  No history of bladder infections. Musculoskeletal:  Arthritis of left shoulder and left knee. Hematologic:  No bleeding disorder.  No history of anemia.  Not anticoagulated. Psycho-social:  The patient is oriented.   The patient has no obvious psychologic or social impairment to understanding our conversation and plan.  SOCIAL and FAMILY HISTORY: Married. Her husband is with her. 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 She was living in [place] until 2013 - but she moved here to be near her home.  PHYSICAL EXAM: There were no vitals taken for this visit.  General: AA F who is alert and generally healthy appearing.  HEENT: Normal. Pupils equal. Neck: Supple. No mass.  No thyroid mass. Lymph Nodes:  No supraclavicular or cervical nodes. Lungs: Clear to auscultation and symmetric breath sounds. Heart:  RRR. No murmur or rub. Breasts:  Right: Bruise at 3 o'clock, No mass  Left - Bruise at 4 o'clock and in left axilla, no mass.  Abdomen: Soft. No mass. No tenderness. No hernia. Normal bowel sounds.  Scars from C section. Rectal: Not done. Extremities:  Good strength and ROM  in upper and lower extremities. Neurologic:   Grossly intact to motor and sensory function. Psychiatric: Has normal mood and affect. Behavior is normal.   DATA REVIEWED: Epic  Alphonsa Overall, MD,  [facility], [place] [address]  [place], [place]    [place] Phone:  [place]:  [phone]

## 2014-05-29 NOTE — Discharge Instructions (Signed)
[facility] - DISCHARGE INSTRUCTIONS TO PATIENT  Activity:  Driving - May drive in 2 or 3 days, if doing well.   Lifting - take it easy for 5 to 7 days, then no limnit  Wound Care:   May wash breast starting Thursday, 7/23.  Diet:  As tolerated.  Follow up appointment:  Call Dr. Pollie Friar office [facility]) at [phone] for an appointment in 2 to 3 weeks.  Medications and dosages:  Resume your home medications.  You have a prescription for:  Vicodin  Call Dr. Lucia Gaskins or his office  [phone]) if you have:  Temperature greater than 100.4,  Persistent nausea and vomiting,  Severe uncontrolled pain,  Redness, tenderness, or signs of infection (pain, swelling, redness, odor or green/yellow discharge around the site),  Difficulty breathing, headache or visual disturbances,  Any other questions or concerns you may have after discharge.  In an emergency, call 911 or go to an Emergency Department at a nearby hospital.    Post Anesthesia Home Care Instructions  Activity: Get plenty of rest for the remainder of the day. A responsible adult should stay with you for 24 hours following the procedure.  For the next 24 hours, DO NOT: -Drive a car -Paediatric nurse -Drink alcoholic beverages -Take any medication unless instructed by your physician -Make any legal decisions or sign important papers.  Meals: Start with liquid foods such as gelatin or soup. Progress to regular foods as tolerated. Avoid greasy, spicy, heavy foods. If nausea and/or vomiting occur, drink only clear liquids until the nausea and/or vomiting subsides. Call your physician if vomiting continues.  Special Instructions/Symptoms: Your throat may feel dry or sore from the anesthesia or the breathing tube placed in your throat during surgery. If this causes discomfort, gargle with warm salt water. The discomfort should disappear within 24 hours.

## 2014-05-29 NOTE — Anesthesia Preprocedure Evaluation (Signed)
Anesthesia Evaluation  Patient identified by MRN, date of birth, ID band Patient awake    Reviewed: Allergy & Precautions, H&P , NPO status , Patient's Chart, lab work & pertinent test results  History of Anesthesia Complications (+) PONV  Airway Mallampati: II  Neck ROM: full    Dental   Pulmonary          Cardiovascular     Neuro/Psych    GI/Hepatic GERD-  ,  Endo/Other  Hypothyroidism Morbid obesity  Renal/GU      Musculoskeletal  (+) Arthritis -,   Abdominal   Peds  Hematology   Anesthesia Other Findings   Reproductive/Obstetrics                           Anesthesia Physical Anesthesia Plan  ASA: II  Anesthesia Plan: General and Regional   Post-op Pain Management: MAC Combined w/ Regional for Post-op pain   Induction: Intravenous  Airway Management Planned: LMA  Additional Equipment:   Intra-op Plan:   Post-operative Plan:   Informed Consent: I have reviewed the patients History and Physical, chart, labs and discussed the procedure including the risks, benefits and alternatives for the proposed anesthesia with the patient or authorized representative who has indicated his/her understanding and acceptance.     Plan Discussed with: CRNA, Anesthesiologist and Surgeon  Anesthesia Plan Comments:         Anesthesia Quick Evaluation

## 2014-05-29 NOTE — Op Note (Signed)
05/29/2014  1:53 PM  PATIENT:  Jill Gonzalez DOB: [birth year] MRN: [id number]  PREOP DIAGNOSIS:  left breast cancer, DCIS  POSTOP DIAGNOSIS:   Left breast cancer, DCIS, 5 o'clock position (Tis, N0)  PROCEDURE:   Procedure(s):  LEFT BREAST LUMPECTOM RADIOACTIVE SEED GUIDED  AND LEFT AXILLARY SENTINEL LYMPH NODE BIOPSY  SURGEON:   Ovidio Kin, M.D.  ANESTHESIA:   general  CRNA: Ronnette Hila, CRNA  General  EBL:  75  ml  DRAINS: none   LOCAL MEDICATIONS USED:   20 cc 14% marcaine, left breast block  SPECIMEN:   Left breast lumpectomy, left axillary sentinel lymph node biopsy  COUNTS CORRECT:  YES  INDICATIONS FOR PROCEDURE:  Jill Gonzalez is a 58 y.o. (DOB: 01-04-1956) AA  female whose primary care physician is BAITY, REGINA, NP and comes for left breast lumpectomy and left axillary sentinel lymph node biopsy.   The options for breast cancer treatment have been discussed with the patient. She elected to proceed with lumpectomy and axillary sentinel lymph node.     The indications and potential complications of surgery were explained to the patient. Potential complications include, but are not limited to, bleeding, infection, the need for further surgery, and nerve injury.     She had a I131 seed placed on 05/24/2014 in her left breast at [facility].  I confirmed the presence of the I131 seed in the pre op area using the Neoprobe.  The seed is in the 5 o'clock position of the left breast.   In the holding area, her left areola was injected with 1 millicurie of Technitium Sulfur Colloid.  OPERATIVE NOTE:   The patient was taken to room # 8 at [facility] where she underwent a general anesthesia  supervised by CRNA: Ronnette Hila, CRNA. Her left breast and axilla were prepped with  ChloraPrep and sterilely draped.    A time-out and the surgical check list was reviewed.    I turned attention to the cancer which was about at the 5 o'clock position of the left breast.    I used the Neoprobe to identify the I131 seed.  I tried to excise an area around the tumor of at least 1 cm.    I excised this block of breast tissue approximately 4.5 cm by 5 cm  in diameter.   I painted the lumpectomy specimen with the 6 color paint kit and did a specimen mammogram which confirmed the mass, clip, and the seed were all in the right position and the specimen.  The specimen was sent to pathology who called back to confirm that they have the seed and the specimen.   I then started the left axillary sentinel lymph node biopsy. I made an incision in the left axilla.  I found a hot area at the junction of the breast and the pectoralis major muscle. I cut down and  identified a hot node that had counts of 140 and the background has 0 counts.    I checked her internal mammary nodes and supraclavicular nodes with the neoprobe and found no other hot area. The axillary node was then sent to pathology.    I then irrigated the wound with saline. I infiltrated approximately 20 mL of 1% local between the  Incisions.  She had had a pectoralis block before surgery.  I placed 6 clips to mark biopsy cavity, one at 12, 3, 6, and 9 o'clock. Two clips were placed on the pectoralis major.  I then closed all the wounds in layers using 3-0 Vicryl sutures for the deep layer. At the skin, I closed the incisions with a 5-0 Monocryl suture. The incisions were then painted with Dermabond.  She had gauze place over the wounds and placed in a breast binder.   The patient tolerated the procedure well, was transported to the recovery room in good condition. Sponge and needle count were correct at the end of the case.   Final pathology is pending.   Alphonsa Overall, MD, [facility] Pager: [phone] Office phone:  [phone]

## 2014-05-29 NOTE — Anesthesia Postprocedure Evaluation (Signed)
Anesthesia Post Note  Patient: Jill Gonzalez  Procedure(s) Performed: Procedure(s) (LRB): LEFT BREAST LUMPECTOM RADIOACTIVE SEED GUIDED  AND LEFT AXILLARY SENTINEL LYMPH NODE BIOPSY (Left)  Anesthesia type: General  Patient location: PACU  Post pain: Pain level controlled and Adequate analgesia  Post assessment: Post-op Vital signs reviewed, Patient's Cardiovascular Status Stable, Respiratory Function Stable, Patent Airway and Pain level controlled  Last Vitals:  Filed Vitals:   05/29/14 1445  BP: 141/82  Pulse: 93  Temp:   Resp: 17    Post vital signs: Reviewed and stable  Level of consciousness: awake, alert  and oriented  Complications: No apparent anesthesia complications

## 2014-05-29 NOTE — Anesthesia Procedure Notes (Signed)
Anesthesia Regional Block:  Pectoralis block  Pre-Anesthetic Checklist: ,, timeout performed, Correct Patient, Correct Site, Correct Laterality, Correct Procedure, Correct Position, site marked, Risks and benefits discussed,  Surgical consent,  Pre-op evaluation,  At surgeon's request and post-op pain management  Laterality: Left  Prep: chloraprep       Needles:  Injection technique: Single-shot  Needle Type: Echogenic Needle     Needle Length: 9cm 9 cm Needle Gauge: 21 and 21 G    Additional Needles:  Procedures: ultrasound guided (picture in chart) Pectoralis block Narrative:  Start time: 05/29/2014 11:24 AM End time: 05/29/2014 11:35 AM Injection made incrementally with aspirations every 5 mL.  Performed by: Personally  Anesthesiologist: Dr Marcie Bal  Additional Notes: Pt tolerated the procedure well.

## 2014-05-29 NOTE — Transfer of Care (Signed)
Immediate Anesthesia Transfer of Care Note  Patient: Jill Gonzalez  Procedure(s) Performed: Procedure(s): LEFT BREAST LUMPECTOM RADIOACTIVE SEED GUIDED  AND LEFT AXILLARY SENTINEL LYMPH NODE BIOPSY (Left)  Patient Location: PACU  Anesthesia Type:General and Regional  Level of Consciousness: awake, alert  and oriented  Airway & Oxygen Therapy: Patient Spontanous Breathing and Patient connected to face mask oxygen  Post-op Assessment: Report given to PACU RN and Post -op Vital signs reviewed and stable  Post vital signs: Reviewed and stable  Complications: No apparent anesthesia complications

## 2014-05-29 NOTE — Progress Notes (Signed)
Assisted Dr. Marcie Bal with left, ultrasound guided, pectoralis block. Side rails up, monitors on throughout procedure. See vital signs in flow sheet. Tolerated Procedure well.

## 2014-05-30 ENCOUNTER — Telehealth (INDEPENDENT_AMBULATORY_CARE_PROVIDER_SITE_OTHER): Payer: Self-pay

## 2014-05-30 NOTE — Telephone Encounter (Signed)
LMOM for pt to call back to make po appt.

## 2014-05-30 NOTE — Telephone Encounter (Signed)
Pt called back to make her po appt. Due to full schedule will send request to [facility] to make appt with Dr Lucia Gaskins and call pt with date. Pt can be reached at [phone].

## 2014-06-05 ENCOUNTER — Other Ambulatory Visit (HOSPITAL_COMMUNITY): Payer: BC Managed Care – PPO

## 2014-06-06 ENCOUNTER — Other Ambulatory Visit (INDEPENDENT_AMBULATORY_CARE_PROVIDER_SITE_OTHER): Payer: Self-pay

## 2014-06-06 ENCOUNTER — Ambulatory Visit (INDEPENDENT_AMBULATORY_CARE_PROVIDER_SITE_OTHER): Payer: BC Managed Care – PPO | Admitting: Surgery

## 2014-06-06 ENCOUNTER — Encounter (INDEPENDENT_AMBULATORY_CARE_PROVIDER_SITE_OTHER): Payer: Self-pay | Admitting: Surgery

## 2014-06-06 VITALS — BP 130/80 | HR 75 | Temp 97.4°F | Ht 61.0 in | Wt 213.0 lb

## 2014-06-06 DIAGNOSIS — C50512 Malignant neoplasm of lower-outer quadrant of left female breast: Secondary | ICD-10-CM

## 2014-06-06 DIAGNOSIS — D0591 Unspecified type of carcinoma in situ of right breast: Secondary | ICD-10-CM

## 2014-06-06 DIAGNOSIS — D0592 Unspecified type of carcinoma in situ of left breast: Secondary | ICD-10-CM

## 2014-06-06 DIAGNOSIS — C50519 Malignant neoplasm of lower-outer quadrant of unspecified female breast: Secondary | ICD-10-CM

## 2014-06-06 NOTE — Progress Notes (Addendum)
Re:   Jill Gonzalez DOB:   03-01-1956 MRN:   [id number]  Breast [facility]  ASSESSMENT AND PLAN: 1.  Left breast cancer, 3:30 o'clock, T1c, N0  Biopsy 05/17/2014 - Path [id number]) - left breast - at least DCIS involving papillary neoplams, ER - 100%, PR - 98%, Ki67 - 3%, lymph node biopsy - negative  Oncology - Dr. Pablo Ledger (no med onc)  Left breast lumpectomy/left axillary SLNBx - 05/29/2014 - Path ([id number]) - 1.8 cm invasive ductal carcinoma, grade 2/3, low grade, ER - 100%, PR - 99%, other markers pending  Plan:  1) will arrange med oncology consult and oncotype.  She has follow up with Dr. Pablo Ledger on 06/14/2014.  Otherwise I will see her back in 6 months.  I wrote her a note to return to work on 06/11/2014.  [Saw Dr. Lindi Adie on 06/29/2014.  He suggested chemo tx for Her2Neu positive tumor.  DN 07/01/2014] [She decided against chemotherapy and radiation therapy.  I left word on answering machine.  DN  08/06/2014]  2.  Benign biopsy of right breast 3.  Benign left axillary lymph node biopsy 4.  Hypothyroid - she just started thyroid replacement. 5.  Arthritis left shoulder and left knee  Chief Complaint  Patient presents with  . Breast Cancer Long Term Follow Up   REFERRING PHYSICIAN: Webb Silversmith, NP  HISTORY OF PRESENT ILLNESS: Jill Gonzalez is a 58 y.o. (DOB: 1956/07/31)  AA F  female whose primary care physician is BAITY, Rollene Fare, NP and comes to [facility] today for follow up of a left breast lumpectomy. Husband with the patient. She is sore, but doing well. I went over in detail her path report, gave her a copy of the report, and answered her questions.  History of breast cancer (05/2014): Her last menstrual period was 2007 (she remembers this because she moved to [place] that year).  She is on no hormones.  Her mother had breast cancer at age 66 (in 58?).  Her mother had a mastectomy and is still living.  She had a mammogram at [facility] - 05/15/2014 - 1. 1.6 cm  complex cystic mass in the 3:30 o'clock position of the left breast. 2. 6 mm group of probably benign microcalcifications in the left breast, adjacent to the complex cystic mass possibly representing calcifications associated with fat necrosis. If the cyst aspiration/biopsy of the left breast mass is negative for malignancy, a followup left diagnostic mammogram would be  recommended in 6 months. 3. Normal right breast intramammary lymph node.  She does not have a MRI - cost an issue.  We talked about the MRI and its benefits and risks.  I told her it could change the treatment recommendations, but it is not an absolute requirment.  Biopsy 05/17/2014 - Path [id number]) - left breast - at least DCIS involving papillary neoplams, ER - 100%, PR - 98%, lymph node biopsy - negative   Past Medical History  Diagnosis Date  . Allergy   . Arthritis   . Blood transfusion without reported diagnosis 1977  . Phlebitis   . GERD (gastroesophageal reflux disease)   . Bronchitis   . Anemia   . Thyroid disease   . Hot flashes   . PONV (postoperative nausea and vomiting)       Past Surgical History  Procedure Laterality Date  . Cesarean section  1981  . Breast biopsy Bilateral 2011, 2012  . Tubal ligation Right 1972  . Tonsillectomy    .  Cervical cone biopsy  1992  . Mouth surgery        Current Outpatient Prescriptions  Medication Sig Dispense Refill  . COD LIVER OIL PO Take 2 capsules by mouth daily.      . Flaxseed, Linseed, (FLAX SEED OIL PO) Take 2 capsules by mouth daily.      . fluticasone (FLONASE) 50 MCG/ACT nasal spray Place into the nose.      Marland Kitchen HYDROcodone-acetaminophen (NORCO/VICODIN) 5-325 MG per tablet Take 1-2 tablets by mouth every 6 (six) hours as needed.  30 tablet  0  . ipratropium (ATROVENT) 0.06 % nasal spray Place 2 sprays into both nostrils 4 (four) times daily.      Marland Kitchen levocetirizine (XYZAL) 5 MG tablet Take by mouth.      . NON FORMULARY Take 2 capsules by mouth daily.  24/7 Solution--Sugar control      . POTASSIUM CITRATE PO Take by mouth.      . levothyroxine (SYNTHROID, LEVOTHROID) 25 MCG tablet Take 1 tablet (25 mcg total) by mouth daily before breakfast.  30 tablet  1   No current facility-administered medications for this visit.      Allergies  Allergen Reactions  . Sulfa Antibiotics Rash    fever    REVIEW OF SYSTEMS: Skin:  No history of rash.  No history of abnormal moles. Infection:  No history of hepatitis or HIV.  No history of MRSA. Neurologic:  No history of stroke.  No history of seizure.  No history of headaches. Cardiac:  No history of hypertension. No history of heart disease.  No history of prior cardiac catheterization.  No history of seeing a cardiologist. Pulmonary:  Does not smoke cigarettes.  No asthma or bronchitis.  No OSA/CPAP.  Endocrine:  No diabetes. Just started thyroid replacement. Gastrointestinal:  No history of stomach disease.  No history of liver disease.  No history of gall bladder disease.  No history of pancreas disease.  Had negative colonoscopy in June 29, 2014 Carlean Purl. Urologic:  No history of kidney stones.  No history of bladder infections. Musculoskeletal:  Arthritis of left shoulder and left knee. Hematologic:  No bleeding disorder.  No history of anemia.  Not anticoagulated. Psycho-social:  The patient is oriented.   The patient has no obvious psychologic or social impairment to understanding our conversation and plan.  SOCIAL and FAMILY HISTORY: Married. Her husband is with her. 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 She was living in [place] until 2013 - but she moved here to be near her home.  PHYSICAL EXAM: BP 130/80  Pulse 75  Temp(Src) 97.4 F (36.3 C)  Ht $R'5\' 1"'RE$  (1.549 m)  Wt 213 lb (96.616 kg)  BMI 40.27 kg/m2  General: AA F who is alert and generally healthy appearing.  HEENT: Normal. Pupils equal. Neck: Supple. No mass.  No thyroid mass. Lymph Nodes:   No supraclavicular, cervical, or axillary nodes. Breasts:  Right: No mass  Left - Incision at 5 o'clock and left axillary incision look good.  DATA REVIEWED: Path to patient.  Alphonsa Overall, MD,  [facility], [address]  [place], [place]    [place] Phone:  [phone] FAX:  [phone]

## 2014-06-07 ENCOUNTER — Encounter: Payer: Self-pay | Admitting: *Deleted

## 2014-06-07 NOTE — Progress Notes (Signed)
Received order from Dr. Lucia Gaskins for oncotype testing. Requisition sent to pathology. PAC sent to [facility].

## 2014-06-08 ENCOUNTER — Telehealth: Payer: Self-pay | Admitting: *Deleted

## 2014-06-08 NOTE — Telephone Encounter (Signed)
Left message for pt to return my call so I can schedule a Med Onc appt w/ her.

## 2014-06-08 NOTE — Telephone Encounter (Signed)
Pt returned my call and I confirmed 07/02/14 appt w/ pt.  Mailed before appt letter, welcoming packet & intake form to pt.  Emailed Holley Raring and [place] at [place] to make them aware.

## 2014-06-08 NOTE — Progress Notes (Signed)
Location of Breast Cancer:Left invasive ductal carcinoma in situ 1.8 cm at 3:30 0' clock position.  Histology per Pathology Report: Diagnosis 05/29/14: 1. Breast, lumpectomy, Left- INVASIVE DUCTAL CARCINOMA WITH PAPILLARY FEATURES, GRADE II/III, SPANNING 1.8 CM.- DUCTAL CARCINOMA IN SITU, LOW GRADE- THE SURGICAL RESECTION MARGINS ARE NEGATIVE FOR CARCINOMA.- SEE ONCOLOGY TABLE BELOW.2. Lymph node, sentinel, biopsy, Left axilla- THERE IS NO EVIDENCE OF CARCINOMA IN 1 OF 1 LYMPH NODE (0/1).3. Lymph node, sentinel, biopsy, Left axilla- BENIGN FIBROADIPOSE TISSUE WITH FAT NECROSIS.- LYMPH NODAL TISSUE IS NOT IDENTIFIED.4. Lymph node, sentinel, biopsy, Left axilla- THERE IS NO EVIDENCE OF CARCINOMA IN 1 OF 1 LYMPH NODE (0/1)./21/2015  Diagnosis 05/17/14: 1. Breast, right, needle core biopsy, mass, 11 o'clock- BENIGN BREAST TISSUE, SEE COMMENT.- NEGATIVE FOR ATYPIA OR MALIGNANCY.  Receptor Status: ER(+), PR (+), Her2-neu (Ki67-3%)  Did patient present with symptoms:Patient palpated nodule and called physician which was confirmed by mammogram on 05/15/2014.  Past/Anticipated interventions by surgeon, if ZOX:[id number] [place] LYMPH NODE BIOPSY 05/29/2014   Past/Anticipated interventions by medical oncology, if any: Chemotherapy:Medical Oncology Consult scheduled for 07/02/2014.  Lymphedema issues, if any:No  Pain issues, if any:No  SAFETY ISSUES:  Prior radiation? No  Pacemaker/ICD? NO  Possible current pregnancy?No. Last menstrual period in 2007.  Is the patient on methotrexate?No  Current Complaints / other details:Married,2 sons,works as Network engineer at [employer]. Menses age 58 first live birth,19  GXP2, no  hormonal replacement therapy. Oncotype ordered on 06/07/14.     Arlyss Repress, RN 06/08/2014,11:32 AM

## 2014-06-14 ENCOUNTER — Ambulatory Visit
Admission: RE | Admit: 2014-06-14 | Discharge: 2014-06-14 | Disposition: A | Discharge status: Home / Self Care | Payer: BC Managed Care – PPO | Source: Ambulatory Visit | Attending: Radiation Oncology | Admitting: Radiation Oncology

## 2014-06-14 ENCOUNTER — Encounter: Payer: Self-pay | Admitting: Radiation Oncology

## 2014-06-14 VITALS — BP 143/75 | HR 80 | Temp 97.9°F | Wt 215.9 lb

## 2014-06-14 DIAGNOSIS — Z510 Encounter for antineoplastic radiation therapy: Secondary | ICD-10-CM | POA: Insufficient documentation

## 2014-06-14 DIAGNOSIS — C50519 Malignant neoplasm of lower-outer quadrant of unspecified female breast: Secondary | ICD-10-CM | POA: Diagnosis not present

## 2014-06-14 DIAGNOSIS — Z170 Estrogen receptor positive status [ER+]: Secondary | ICD-10-CM | POA: Diagnosis not present

## 2014-06-14 DIAGNOSIS — C50512 Malignant neoplasm of lower-outer quadrant of left female breast: Secondary | ICD-10-CM

## 2014-06-14 NOTE — Progress Notes (Signed)
Please see the Nurse Progress Note in the MD Initial Consult Encounter for this patient. 

## 2014-06-15 ENCOUNTER — Ambulatory Visit (AMBULATORY_SURGERY_CENTER): Payer: Self-pay

## 2014-06-15 ENCOUNTER — Telehealth: Payer: Self-pay | Admitting: *Deleted

## 2014-06-15 VITALS — Ht 61.5 in | Wt 216.0 lb

## 2014-06-15 DIAGNOSIS — Z1211 Encounter for screening for malignant neoplasm of colon: Secondary | ICD-10-CM

## 2014-06-15 MED ORDER — SUPREP BOWEL PREP KIT 17.5-3.13-1.6 GM/177ML PO SOLN: 1.0000 | Freq: Once | ORAL | Status: DC

## 2014-06-15 NOTE — Telephone Encounter (Signed)
Received request from Dr. Lindi Adie to schedule Jill Gonzalez sooner.  Called and left a message for the Jill Gonzalez to return my call so I can schedule her.

## 2014-06-15 NOTE — Progress Notes (Signed)
   Department of Radiation Oncology  Phone:  [phone] Fax:        [phone]   Name: Jill Gonzalez MRN: [id number]  DOB: [birth year]  Date: 06/14/2014  Follow Up Visit Note  Diagnosis:    ICD-9-CM  1. Breast cancer of lower-outer quadrant of left female breast 174.5   Interval History: Jill Gonzalez presents today for routine followup.  She had her lumpectomy on 7/21.  She was found to have invasive cancer on resection which measured 1.8 cm and was Grade II.  She had negative margins and 2 sentinel lymph nodes were negative. Her tumor was ER+and PR+ and the ki 67 was 3%.  An Oncotype has been ordered and she is scheduled to see medical oncology on 8/24. She still has pain in her left breast but this is improving.  She has no swelling or erythema. She is accompanied by her husband.   Allergies:  Allergies  Allergen Reactions  . Sulfa Antibiotics Rash    fever    Medications:  Current Outpatient Prescriptions  Medication Sig Dispense Refill  . COD LIVER OIL PO Take 2 capsules by mouth daily.      . Flaxseed, Linseed, (FLAX SEED OIL PO) Take 2 capsules by mouth daily.      . fluticasone (FLONASE) 50 MCG/ACT nasal spray Place into the nose.      Marland Kitchen HYDROcodone-acetaminophen (NORCO/VICODIN) 5-325 MG per tablet Take 1-2 tablets by mouth every 6 (six) hours as needed.  30 tablet  0  . ipratropium (ATROVENT) 0.06 % nasal spray Place 2 sprays into both nostrils 4 (four) times daily.      Marland Kitchen levocetirizine (XYZAL) 5 MG tablet Take by mouth.      . NON FORMULARY Take 2 capsules by mouth daily. 24/7 Solution--Sugar control      . POTASSIUM CITRATE PO Take by mouth.      Manus Gunning BOWEL PREP SOLN Take 1 kit by mouth once.  354 mL  0   No current facility-administered medications for this encounter.    Physical Exam:  Filed Vitals:   06/14/14 1012  BP: 143/75  Pulse: 80  Temp: 97.9 F (36.6 C)  Weight: 215 lb 14.4 oz (97.932 kg)   Healing lumpectomy scar in the left breast. Patient  is alert and oriented.   IMPRESSION: Jill Gonzalez is a 58 y.o. female sp lumpectomy pending oncotype then radiation.   PLAN:  I spoke to the patient today regarding her diagnosis and options for treatment. We discussed the role of radiation in decreasing local failures in patients who undergo lumpectomy. We discussed the process of simulation and the placement tattoos. We discussed 4-6 weeks of treatment as an outpatient. We discussed the possibility of asymptomatic lung damage. We discussed the low likelihood of secondary malignancies. We discussed the possible side effects including but not limited to skin redness, fatigue, permanent skin darkening, and breast swelling. I have left it with her that she is to call after meeting with medical oncology and schedule her simulation if she is not going to receive chemotherapy.  I did clarify with her that if she needed chemotherapy this would be performed prior to radiation.  I spent 20 minutes face to face with the patient and more than 50% of that time was spent in counseling and/or coordination of care.     Thea Silversmith, MD

## 2014-06-15 NOTE — Progress Notes (Signed)
No allergies to eggs or soy No diet/weight loss meds No home oxygen No past problems with anesthesia except PONV  Has email  Emmi instructions given for colonoscopy 

## 2014-06-18 ENCOUNTER — Encounter: Payer: Self-pay | Admitting: Internal Medicine

## 2014-06-22 ENCOUNTER — Ambulatory Visit (HOSPITAL_BASED_OUTPATIENT_CLINIC_OR_DEPARTMENT_OTHER): Payer: BC Managed Care – PPO | Admitting: Hematology and Oncology

## 2014-06-22 ENCOUNTER — Ambulatory Visit: Payer: BC Managed Care – PPO

## 2014-06-22 ENCOUNTER — Ambulatory Visit (HOSPITAL_BASED_OUTPATIENT_CLINIC_OR_DEPARTMENT_OTHER): Payer: BC Managed Care – PPO

## 2014-06-22 ENCOUNTER — Encounter: Payer: Self-pay | Admitting: Hematology and Oncology

## 2014-06-22 VITALS — BP 130/76 | HR 85 | Temp 97.9°F | Resp 18 | Ht 61.5 in | Wt 213.6 lb

## 2014-06-22 DIAGNOSIS — Z170 Estrogen receptor positive status [ER+]: Secondary | ICD-10-CM

## 2014-06-22 DIAGNOSIS — C50519 Malignant neoplasm of lower-outer quadrant of unspecified female breast: Secondary | ICD-10-CM

## 2014-06-22 DIAGNOSIS — Z803 Family history of malignant neoplasm of breast: Secondary | ICD-10-CM

## 2014-06-22 DIAGNOSIS — C50512 Malignant neoplasm of lower-outer quadrant of left female breast: Secondary | ICD-10-CM

## 2014-06-22 LAB — CBC WITH DIFFERENTIAL/PLATELET
BASO%: 0.4 % (ref 0.0–2.0)
Basophils Absolute: 0 10*3/uL (ref 0.0–0.1)
EOS%: 2.8 % (ref 0.0–7.0)
Eosinophils Absolute: 0.1 10*3/uL (ref 0.0–0.5)
HCT: 36.2 % (ref 34.8–46.6)
HEMOGLOBIN: 11.8 g/dL (ref 11.6–15.9)
LYMPH#: 1.7 10*3/uL (ref 0.9–3.3)
LYMPH%: 32.7 % (ref 14.0–49.7)
MCH: 30.8 pg (ref 25.1–34.0)
MCHC: 32.5 g/dL (ref 31.5–36.0)
MCV: 95 fL (ref 79.5–101.0)
MONO#: 0.7 10*3/uL (ref 0.1–0.9)
MONO%: 13.8 % (ref 0.0–14.0)
NEUT#: 2.6 10*3/uL (ref 1.5–6.5)
NEUT%: 50.3 % (ref 38.4–76.8)
Platelets: 328 10*3/uL (ref 145–400)
RBC: 3.82 10*6/uL (ref 3.70–5.45)
RDW: 13.1 % (ref 11.2–14.5)
WBC: 5.2 10*3/uL (ref 3.9–10.3)

## 2014-06-22 LAB — COMPREHENSIVE METABOLIC PANEL (CC13)
ALBUMIN: 3.9 g/dL (ref 3.5–5.0)
ALT: 13 U/L (ref 0–55)
ANION GAP: 7 meq/L (ref 3–11)
AST: 17 U/L (ref 5–34)
Alkaline Phosphatase: 69 U/L (ref 40–150)
BUN: 10.1 mg/dL (ref 7.0–26.0)
CHLORIDE: 106 meq/L (ref 98–109)
CO2: 31 meq/L — AB (ref 22–29)
CREATININE: 0.7 mg/dL (ref 0.6–1.1)
Calcium: 9.3 mg/dL (ref 8.4–10.4)
Glucose: 93 mg/dl (ref 70–140)
POTASSIUM: 3.6 meq/L (ref 3.5–5.1)
SODIUM: 143 meq/L (ref 136–145)
TOTAL PROTEIN: 7.3 g/dL (ref 6.4–8.3)
Total Bilirubin: 0.34 mg/dL (ref 0.20–1.20)

## 2014-06-22 NOTE — Progress Notes (Signed)
[facility] NOTE  Patient Care Team: Webb Silversmith, NP as PCP - General (Internal Medicine) Shann Medal, MD as Consulting Physician (General Surgery) Thea Silversmith, MD as Consulting Physician (Radiation Oncology)  CHIEF COMPLAINTS/PURPOSE OF CONSULTATION:  Newly diagnosed breast cancer  HISTORY OF PRESENTING ILLNESS:  Jill Gonzalez 58 y.o. African American female is here because of recent diagnosis of left breast cancer. She felt a palpable nodule and underwent a mammogram on 04/27/2014 that showed a possible mass with microcalcification. In 2013 she had a mass that was identified mammogram and recommended biopsy but patient canceled at that time it measured 1.3 x 0.8 x 0.7 cm. She subsequently underwent a biopsy on 05/17/2014 that showed left DCIS with papillary features she also biopsied in the right side which is benign and intramammary lymph node on the left was also biopsied which was negative. Patient underwent left lumpectomy on 05/29/2049 that revealed invasive ductal carcinoma with papillary features grade 2/ 3 1.8 cm in size with low-grade DCIS 2 sentinel lymph nodes were negative. Her pathologic staging is T1 C. N0 M0 stage IA ER 100% positive PR 99% positive HER-2 amplified at 2.28. Patient is here today accompanied by her husband to discuss the role of adjuvant therapy. She has recovered well from the surgery and has mild discomfort under the breast but otherwise feeling quite good.  I reviewed her records extensively and collaborated the history with the patient.  SUMMARY OF ONCOLOGIC HISTORY:   Breast cancer of lower-outer quadrant of left female breast   06/22/2012 Mammogram Left breast abnormality. Rec Biopsy but patient refused   04/27/2014 Mammogram Mass with microcalcifications   05/21/2014 Initial Diagnosis Breast cancer of lower-outer quadrant of left female breast   05/27/2014 Initial Biopsy Left Breast DCIS with papillary features;  left internal  mammary lymph node benign; right breast biopsy benign   05/29/2014 Surgery Left lumpectomy and sentinel lymph node biopsy: Invasive ductal carcinoma with papillary features grade 2/3 1.8 cm with low-grade DCIS 2 sentinel lymph nodes negative PR 100% PR 99% HER-2 2.28 with gene copy #2.85 (Positive).    In terms of breast cancer risk profile:  She menarched at early age of 7 and went to menopause at age 30  She had 2 pregnancy, her first child was born at age 70  She did not received birth control pills She was never exposed to fertility medications or hormone replacement therapy.  She has afamily history of Breast/and/GI cancer  MEDICAL HISTORY:  Past Medical History  Diagnosis Date  . Allergy   . Arthritis   . Blood transfusion without reported diagnosis 1977  . Phlebitis   . GERD (gastroesophageal reflux disease)   . Bronchitis   . Anemia   . Thyroid disease   . Hot flashes   . PONV (postoperative nausea and vomiting)     SURGICAL HISTORY: Past Surgical History  Procedure Laterality Date  . Cesarean section  1981  . Breast biopsy Bilateral 2011, 2012  . Tubal ligation Right 1972  . Tonsillectomy    . Cervical cone biopsy  1992  . Mouth surgery    . Breast lumpectomy      left    SOCIAL HISTORY: History   Social History  . Marital Status: Married    Spouse Name: N/A    Number of Children: 2  . Years of Education: N/A   Occupational History  . 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  Smoking status: Never Smoker   . Smokeless tobacco: Never Used  . Alcohol Use: No  . Drug Use: No  . Sexual Activity: Yes   Other Topics Concern  . Not on file   Social History Narrative  . No narrative on file   patient has 2 sons age 32 and 8 and 4 grandkids she is currently working on making her living will and power of attorney.  FAMILY HISTORY: Family History  Problem Relation Age of Onset  . Arthritis Mother   . Cancer Mother 53    Breast  alive age 7  . Hyperlipidemia Mother   . Heart disease Mother   . Hypertension Mother   . Stroke Brother   . Diabetes Brother   . Hypertension Sister   . Stomach cancer Father   . Colon cancer Neg Hx   . Pancreatic cancer Neg Hx   . Cancer Paternal Uncle    Father died of gastrointestinal cancer at age 37 mother was diagnosed with breast cancer in her late 5s and she is doing well today 6 distant uncle had a head and neck cancer at an unknown age of diagnosis  ALLERGIES:  is allergic to sulfa antibiotics.  MEDICATIONS:  Current Outpatient Prescriptions  Medication Sig Dispense Refill  . COD LIVER OIL PO Take 2 capsules by mouth daily.      . Flaxseed, Linseed, (FLAX SEED OIL PO) Take 2 capsules by mouth daily.      . fluticasone (FLONASE) 50 MCG/ACT nasal spray Place into the nose.      Marland Kitchen ipratropium (ATROVENT) 0.06 % nasal spray Place 2 sprays into both nostrils 4 (four) times daily.      Marland Kitchen levocetirizine (XYZAL) 5 MG tablet Take by mouth.      . NON FORMULARY Take 2 capsules by mouth daily. 24/7 Solution--Sugar control      . POTASSIUM CITRATE PO Take by mouth.      Marland Kitchen HYDROcodone-acetaminophen (NORCO/VICODIN) 5-325 MG per tablet Take 1-2 tablets by mouth every 6 (six) hours as needed.  30 tablet  0  . SUPREP BOWEL PREP SOLN Take 1 kit by mouth once.  354 mL  0   No current facility-administered medications for this visit.    REVIEW OF SYSTEMS:   Constitutional: Denies fevers, chills or abnormal night sweats Eyes: Denies blurriness of vision, double vision or watery eyes Ears, nose, mouth, throat, and face: Denies mucositis or sore throat Respiratory: Denies cough, dyspnea or wheezes Cardiovascular: Denies palpitation, chest discomfort or lower extremity swelling Gastrointestinal:  Denies nausea, heartburn or change in bowel habits Skin: Denies abnormal skin rashes Lymphatics: Denies new lymphadenopathy or easy bruising Neurological:Denies numbness, tingling or new  weaknesses Behavioral/Psych: Mood is stable, no new changes  All other systems were reviewed with the patient and are negative.  PHYSICAL EXAMINATION: ECOG PERFORMANCE STATUS: 0 - Asymptomatic  Filed Vitals:   06/22/14 1051  BP: 130/76  Pulse: 85  Temp: 97.9 F (36.6 C)  Resp: 18   Filed Weights   06/22/14 1051  Weight: 213 lb 9.6 oz (96.888 kg)    GENERAL:alert, no distress and comfortable SKIN: skin color, texture, turgor are normal, no rashes or significant lesions EYES: normal, conjunctiva are pink and non-injected, sclera clear OROPHARYNX:no exudate, no erythema and lips, buccal mucosa, and tongue normal  NECK: supple, thyroid normal size, non-tender, without nodularity LYMPH:  no palpable lymphadenopathy in the cervical, axillary or inguinal LUNGS: clear to auscultation and percussion with normal breathing  effort HEART: regular rate & rhythm and no murmurs and no lower extremity edema ABDOMEN:abdomen soft, non-tender and normal bowel sounds Musculoskeletal:no cyanosis of digits and no clubbing  PSYCH: alert & oriented x 3 with fluent speech NEURO: no focal motor/sensory deficits BREAST: elevation of the left breast reveals evidence of prior lumpectomy and sentinel lymph node study the wounds appear to be healing well no palpable lumps or nodules in the remainder of the breast. The right breast does not have any nodules or axillary lymph nodes  LABORATORY DATA:  I have reviewed the data as listed Lab Results  Component Value Date   WBC 5.2 06/22/2014   HGB 11.8 06/22/2014   HCT 36.2 06/22/2014   MCV 95.0 06/22/2014   PLT 328 06/22/2014   Lab Results  Component Value Date   NA 140 04/25/2014   K 3.2* 04/25/2014   CL 101 04/25/2014   CO2 34* 04/25/2014    RADIOGRAPHIC STUDIES: I have personally reviewed the radiological reports and agreed with the findings in the report.  ASSESSMENT AND PLAN:  Breast cancer of lower-outer quadrant of left female breast 1. Left  breast invasive ductal carcinoma with papillary features with low-grade DCIS 1.8 cm 2 sentinel lymph nodes negative grade 2/3 ER 100% positive PR 99% positive HER-2/CEP 17 ratio of 2.28 gene copy #2.85 T1 C. N0 M0 stage IA.  2. I discussed with her in great detail the details of her pathology including the significance of the ER PR and HER-2/neu receptors because of HER-2/neu amplification, I recommended adjuvant systemic chemotherapy with Taxotere carboplatin Herceptin and Perjeta once every 3 weeks for 6 cycles to decrease the risk of recurrence of her breast cancer. This can be followed by breast irradiation and maintenance Herceptin for one year along with antiestrogen therapy.  3. Chemotherapy counseling: I discussed with her the risks and benefits of chemotherapy including the risk of chemotherapy-related toxicities like nausea/vomiting, and allergic reactions, neuropathy, risk of infections, loss of taste and appetite, mouth sores rashes, risk of anemia and easy bruising due to the thrombocytopenia, cardiac dysfunction related to Herceptin and Perjeta. After hearing the risks and benefits of treatment patient is not very sure whether she wants to go with chemotherapy. I provided her with written literature on the treatment regimen as and she will think about different options and come back and discuss with me in 2 weeks.  4. Chemotherapy related literature was provided. The patient agrees to go on chemotherapy, echocardiogram  will be scheduled, and a request for port placement will be made and patient will be set up for chemotherapy education.  5. if patient not want to go through doublet chemotherapy, I would at least consider her for Taxotere Herceptin Perjeta followed by Herceptin maintenance and antiestrogen therapy.   Family history of breast cancer Patient's mother was diagnosed with breast cancer at age 25. There are no other family members with breast or ovarian cancers. Hence based on  NCCN guidelines I do not believe there is an indication for genetic testing.   All questions were answered. The patient knows to call the clinic with any problems, questions or concerns. I spent 55 minutes counseling the patient face to face. The total time spent in the appointment was 60 minutes and more than 50% was on counseling.     Rulon Eisenmenger, MD 06/22/2014 12:59 PM

## 2014-06-22 NOTE — Progress Notes (Signed)
Checked in new patient with no financial issues.she has appt card and has not been out of the country. copay to be billed.

## 2014-06-22 NOTE — Patient Instructions (Signed)
Paclitaxel injection (Taxol) What is this medicine? PACLITAXEL (PAK li TAX el) is a chemotherapy drug. It targets fast dividing cells, like cancer cells, and causes these cells to die. This medicine is used to treat ovarian cancer, breast cancer, and other cancers. This medicine may be used for other purposes; ask your health care provider or pharmacist if you have questions. COMMON BRAND NAME(S): Onxol, Taxol What should I tell my health care provider before I take this medicine? They need to know if you have any of these conditions: -blood disorders -irregular heartbeat -infection (especially a virus infection such as chickenpox, cold sores, or herpes) -liver disease -previous or ongoing radiation therapy -an unusual or allergic reaction to paclitaxel, alcohol, polyoxyethylated castor oil, other chemotherapy agents, other medicines, foods, dyes, or preservatives -pregnant or trying to get pregnant -breast-feeding How should I use this medicine? This drug is given as an infusion into a vein. It is administered in a hospital or clinic by a specially trained health care professional. Talk to your pediatrician regarding the use of this medicine in children. Special care may be needed. Overdosage: If you think you have taken too much of this medicine contact a poison control center or emergency room at once. NOTE: This medicine is only for you. Do not share this medicine with others. What if I miss a dose? It is important not to miss your dose. Call your doctor or health care professional if you are unable to keep an appointment. What may interact with this medicine? Do not take this medicine with any of the following medications: -disulfiram -metronidazole This medicine may also interact with the following medications: -cyclosporine -diazepam -ketoconazole -medicines to increase blood counts like filgrastim, pegfilgrastim, sargramostim -other chemotherapy drugs like cisplatin,  doxorubicin, epirubicin, etoposide, teniposide, vincristine -quinidine -testosterone -vaccines -verapamil Talk to your doctor or health care professional before taking any of these medicines: -acetaminophen -aspirin -ibuprofen -ketoprofen -naproxen This list may not describe all possible interactions. Give your health care provider a list of all the medicines, herbs, non-prescription drugs, or dietary supplements you use. Also tell them if you smoke, drink alcohol, or use illegal drugs. Some items may interact with your medicine. What should I watch for while using this medicine? Your condition will be monitored carefully while you are receiving this medicine. You will need important blood work done while you are taking this medicine. This drug may make you feel generally unwell. This is not uncommon, as chemotherapy can affect healthy cells as well as cancer cells. Report any side effects. Continue your course of treatment even though you feel ill unless your doctor tells you to stop. In some cases, you may be given additional medicines to help with side effects. Follow all directions for their use. Call your doctor or health care professional for advice if you get a fever, chills or sore throat, or other symptoms of a cold or flu. Do not treat yourself. This drug decreases your body's ability to fight infections. Try to avoid being around people who are sick. This medicine may increase your risk to bruise or bleed. Call your doctor or health care professional if you notice any unusual bleeding. Be careful brushing and flossing your teeth or using a toothpick because you may get an infection or bleed more easily. If you have any dental work done, tell your dentist you are receiving this medicine. Avoid taking products that contain aspirin, acetaminophen, ibuprofen, naproxen, or ketoprofen unless instructed by your doctor. These medicines may hide a  fever. Do not become pregnant while taking this  medicine. Women should inform their doctor if they wish to become pregnant or think they might be pregnant. There is a potential for serious side effects to an unborn child. Talk to your health care professional or pharmacist for more information. Do not breast-feed an infant while taking this medicine. Men are advised not to father a child while receiving this medicine. What side effects may I notice from receiving this medicine? Side effects that you should report to your doctor or health care professional as soon as possible: -allergic reactions like skin rash, itching or hives, swelling of the face, lips, or tongue -low blood counts - This drug may decrease the number of white blood cells, red blood cells and platelets. You may be at increased risk for infections and bleeding. -signs of infection - fever or chills, cough, sore throat, pain or difficulty passing urine -signs of decreased platelets or bleeding - bruising, pinpoint red spots on the skin, black, tarry stools, nosebleeds -signs of decreased red blood cells - unusually weak or tired, fainting spells, lightheadedness -breathing problems -chest pain -high or low blood pressure -mouth sores -nausea and vomiting -pain, swelling, redness or irritation at the injection site -pain, tingling, numbness in the hands or feet -slow or irregular heartbeat -swelling of the ankle, feet, hands Side effects that usually do not require medical attention (report to your doctor or health care professional if they continue or are bothersome): -bone pain -complete hair loss including hair on your head, underarms, pubic hair, eyebrows, and eyelashes -changes in the color of fingernails -diarrhea -loosening of the fingernails -loss of appetite -muscle or joint pain -red flush to skin -sweating This list may not describe all possible side effects. Call your doctor for medical advice about side effects. You may report side effects to FDA at  1-800-FDA-1088. Where should I keep my medicine? This drug is given in a hospital or clinic and will not be stored at home. NOTE: This sheet is a summary. It may not cover all possible information. If you have questions about this medicine, talk to your doctor, pharmacist, or health care provider.  2015, [employer] Standard. (2012-12-19 16:41:21)   Carboplatin injection What is this medicine? CARBOPLATIN (KAR boe pla tin) is a chemotherapy drug. It targets fast dividing cells, like cancer cells, and causes these cells to die. This medicine is used to treat ovarian cancer and many other cancers. This medicine may be used for other purposes; ask your health care provider or pharmacist if you have questions. COMMON BRAND NAME(S): Paraplatin What should I tell my health care provider before I take this medicine? They need to know if you have any of these conditions: -blood disorders -hearing problems -kidney disease -recent or ongoing radiation therapy -an unusual or allergic reaction to carboplatin, cisplatin, other chemotherapy, other medicines, foods, dyes, or preservatives -pregnant or trying to get pregnant -breast-feeding How should I use this medicine? This drug is usually given as an infusion into a vein. It is administered in a hospital or clinic by a specially trained health care professional. Talk to your pediatrician regarding the use of this medicine in children. Special care may be needed. Overdosage: If you think you have taken too much of this medicine contact a poison control center or emergency room at once. NOTE: This medicine is only for you. Do not share this medicine with others. What if I miss a dose? It is important not to miss a dose.  Call your doctor or health care professional if you are unable to keep an appointment. What may interact with this medicine? -medicines for seizures -medicines to increase blood counts like filgrastim, pegfilgrastim,  sargramostim -some antibiotics like amikacin, gentamicin, neomycin, streptomycin, tobramycin -vaccines Talk to your doctor or health care professional before taking any of these medicines: -acetaminophen -aspirin -ibuprofen -ketoprofen -naproxen This list may not describe all possible interactions. Give your health care provider a list of all the medicines, herbs, non-prescription drugs, or dietary supplements you use. Also tell them if you smoke, drink alcohol, or use illegal drugs. Some items may interact with your medicine. What should I watch for while using this medicine? Your condition will be monitored carefully while you are receiving this medicine. You will need important blood work done while you are taking this medicine. This drug may make you feel generally unwell. This is not uncommon, as chemotherapy can affect healthy cells as well as cancer cells. Report any side effects. Continue your course of treatment even though you feel ill unless your doctor tells you to stop. In some cases, you may be given additional medicines to help with side effects. Follow all directions for their use. Call your doctor or health care professional for advice if you get a fever, chills or sore throat, or other symptoms of a cold or flu. Do not treat yourself. This drug decreases your body's ability to fight infections. Try to avoid being around people who are sick. This medicine may increase your risk to bruise or bleed. Call your doctor or health care professional if you notice any unusual bleeding. Be careful brushing and flossing your teeth or using a toothpick because you may get an infection or bleed more easily. If you have any dental work done, tell your dentist you are receiving this medicine. Avoid taking products that contain aspirin, acetaminophen, ibuprofen, naproxen, or ketoprofen unless instructed by your doctor. These medicines may hide a fever. Do not become pregnant while taking this  medicine. Women should inform their doctor if they wish to become pregnant or think they might be pregnant. There is a potential for serious side effects to an unborn child. Talk to your health care professional or pharmacist for more information. Do not breast-feed an infant while taking this medicine. What side effects may I notice from receiving this medicine? Side effects that you should report to your doctor or health care professional as soon as possible: -allergic reactions like skin rash, itching or hives, swelling of the face, lips, or tongue -signs of infection - fever or chills, cough, sore throat, pain or difficulty passing urine -signs of decreased platelets or bleeding - bruising, pinpoint red spots on the skin, black, tarry stools, nosebleeds -signs of decreased red blood cells - unusually weak or tired, fainting spells, lightheadedness -breathing problems -changes in hearing -changes in vision -chest pain -high blood pressure -low blood counts - This drug may decrease the number of white blood cells, red blood cells and platelets. You may be at increased risk for infections and bleeding. -nausea and vomiting -pain, swelling, redness or irritation at the injection site -pain, tingling, numbness in the hands or feet -problems with balance, talking, walking -trouble passing urine or change in the amount of urine Side effects that usually do not require medical attention (report to your doctor or health care professional if they continue or are bothersome): -hair loss -loss of appetite -metallic taste in the mouth or changes in taste This list may not  describe all possible side effects. Call your doctor for medical advice about side effects. You may report side effects to FDA at 1-800-FDA-1088. Where should I keep my medicine? This drug is given in a hospital or clinic and will not be stored at home. NOTE: This sheet is a summary. It may not cover all possible information. If you  have questions about this medicine, talk to your doctor, pharmacist, or health care provider.  2015, [employer] Standard. (2008-01-31 14:38:05)   Trastuzumab injection for infusion (herceptin) What is this medicine? TRASTUZUMAB (tras TOO zoo mab) is a monoclonal antibody. It targets a protein called HER2. This protein is found in some stomach and breast cancers. This medicine can stop cancer cell growth. This medicine may be used with other cancer treatments. This medicine may be used for other purposes; ask your health care provider or pharmacist if you have questions. COMMON BRAND NAME(S): Herceptin What should I tell my health care provider before I take this medicine? They need to know if you have any of these conditions: -heart disease -heart failure -infection (especially a virus infection such as chickenpox, cold sores, or herpes) -lung or breathing disease, like asthma -recent or ongoing radiation therapy -an unusual or allergic reaction to trastuzumab, benzyl alcohol, or other medications, foods, dyes, or preservatives -pregnant or trying to get pregnant -breast-feeding How should I use this medicine? This drug is given as an infusion into a vein. It is administered in a hospital or clinic by a specially trained health care professional. Talk to your pediatrician regarding the use of this medicine in children. This medicine is not approved for use in children. Overdosage: If you think you have taken too much of this medicine contact a poison control center or emergency room at once. NOTE: This medicine is only for you. Do not share this medicine with others. What if I miss a dose? It is important not to miss a dose. Call your doctor or health care professional if you are unable to keep an appointment. What may interact with this medicine? -cyclophosphamide -doxorubicin -warfarin This list may not describe all possible interactions. Give your health care provider a list of  all the medicines, herbs, non-prescription drugs, or dietary supplements you use. Also tell them if you smoke, drink alcohol, or use illegal drugs. Some items may interact with your medicine. What should I watch for while using this medicine? Visit your doctor for checks on your progress. Report any side effects. Continue your course of treatment even though you feel ill unless your doctor tells you to stop. Call your doctor or health care professional for advice if you get a fever, chills or sore throat, or other symptoms of a cold or flu. Do not treat yourself. Try to avoid being around people who are sick. You may experience fever, chills and shaking during your first infusion. These effects are usually mild and can be treated with other medicines. Report any side effects during the infusion to your health care professional. Fever and chills usually do not happen with later infusions. What side effects may I notice from receiving this medicine? Side effects that you should report to your doctor or other health care professional as soon as possible: -breathing difficulties -chest pain or palpitations -cough -dizziness or fainting -fever or chills, sore throat -skin rash, itching or hives -swelling of the legs or ankles -unusually weak or tired Side effects that usually do not require medical attention (report to your doctor or other health care  professional if they continue or are bothersome): -loss of appetite -headache -muscle aches -nausea This list may not describe all possible side effects. Call your doctor for medical advice about side effects. You may report side effects to FDA at 1-800-FDA-1088. Where should I keep my medicine? This drug is given in a hospital or clinic and will not be stored at home. NOTE: This sheet is a summary. It may not cover all possible information. If you have questions about this medicine, talk to your doctor, pharmacist, or health care provider.  2015,  [employer] Standard. (2009-08-30 13:43:15)   Pertuzumab injection (Perjeta) What is this medicine? PERTUZUMAB (per TOOZ ue mab) is a monoclonal antibody that targets a protein called HER2. HER2 is found in some breast cancers. This medicine can stop cancer cell growth. This medicine is used with other cancer treatments. This medicine may be used for other purposes; ask your health care provider or pharmacist if you have questions. COMMON BRAND NAME(S): PERJETA What should I tell my health care provider before I take this medicine? They need to know if you have any of these conditions: -heart disease -heart failure -high blood pressure -history of irregular heart beat -recent or ongoing radiation therapy -an unusual or allergic reaction to pertuzumab, other medicines, foods, dyes, or preservatives -pregnant or trying to get pregnant -breast-feeding How should I use this medicine? This medicine is for infusion into a vein. It is given by a health care professional in a hospital or clinic setting. Talk to your pediatrician regarding the use of this medicine in children. Special care may be needed. Overdosage: If you think you've taken too much of this medicine contact a poison control center or emergency room at once. Overdosage: If you think you have taken too much of this medicine contact a poison control center or emergency room at once. NOTE: This medicine is only for you. Do not share this medicine with others. What if I miss a dose? It is important not to miss your dose. Call your doctor or health care professional if you are unable to keep an appointment. What may interact with this medicine? Interactions are not expected. Give your health care provider a list of all the medicines, herbs, non-prescription drugs, or dietary supplements you use. Also tell them if you smoke, drink alcohol, or use illegal drugs. Some items may interact with your medicine. This list may not describe  all possible interactions. Give your health care provider a list of all the medicines, herbs, non-prescription drugs, or dietary supplements you use. Also tell them if you smoke, drink alcohol, or use illegal drugs. Some items may interact with your medicine. What should I watch for while using this medicine? Your condition will be monitored carefully while you are receiving this medicine. Report any side effects. Continue your course of treatment even though you feel ill unless your doctor tells you to stop. Do not become pregnant while taking this medicine. Women should inform their doctor if they wish to become pregnant or think they might be pregnant. There is a potential for serious side effects to an unborn child. Talk to your health care professional or pharmacist for more information. Do not breast-feed an infant while taking this medicine. Call your doctor or health care professional for advice if you get a fever, chills or sore throat, or other symptoms of a cold or flu. Do not treat yourself. Try to avoid being around people who are sick. You may experience fever, chills, and headache  during the infusion. Report any side effects during the infusion to your health care professional. What side effects may I notice from receiving this medicine? Side effects that you should report to your doctor or health care professional as soon as possible: -breathing problems -chest pain or palpitations -dizziness -feeling faint or lightheaded -fever or chills -skin rash, itching or hives -sore throat -swelling of the face, lips, or tongue -swelling of the legs or ankles -unusually weak or tired Side effects that usually do not require medical attention (Report these to your doctor or health care professional if they continue or are bothersome.): -diarrhea -hair loss -nausea, vomiting -tiredness This list may not describe all possible side effects. Call your doctor for medical advice about side  effects. You may report side effects to FDA at 1-800-FDA-1088. Where should I keep my medicine? This drug is given in a hospital or clinic and will not be stored at home. NOTE: This sheet is a summary. It may not cover all possible information. If you have questions about this medicine, talk to your doctor, pharmacist, or health care provider.  2015, [employer] Standard. (2012-08-24 16:54:15)

## 2014-06-22 NOTE — Progress Notes (Signed)
MD notes created during office visit sent to scan.  Copy to patient.

## 2014-06-22 NOTE — Assessment & Plan Note (Addendum)
1. Left breast invasive ductal carcinoma with papillary features with low-grade DCIS 1.8 cm 2 sentinel lymph nodes negative grade 2/3 ER 100% positive PR 99% positive HER-2/CEP 17 ratio of 2.28 gene copy #2.85 T1 C. N0 M0 stage IA.  2. I discussed with her in great detail the details of her pathology including the significance of the ER PR and HER-2/neu receptors because of HER-2/neu amplification, I recommended adjuvant systemic chemotherapy with Taxotere carboplatin Herceptin and Perjeta once every 3 weeks for 6 cycles to decrease the risk of recurrence of her breast cancer. This can be followed by breast irradiation and maintenance Herceptin for one year along with antiestrogen therapy.  3. Chemotherapy counseling: I discussed with her the risks and benefits of chemotherapy including the risk of chemotherapy-related toxicities like nausea/vomiting, and allergic reactions, neuropathy, risk of infections, loss of taste and appetite, mouth sores rashes, risk of anemia and easy bruising due to the thrombocytopenia, cardiac dysfunction related to Herceptin and Perjeta. After hearing the risks and benefits of treatment patient is not very sure whether she wants to go with chemotherapy. I provided her with written literature on the treatment regimen as and she will think about different options and come back and discuss with me in 2 weeks.  4. Chemotherapy related literature was provided. The patient agrees to go on chemotherapy, echocardiogram  will be scheduled, and a request for port placement will be made and patient will be set up for chemotherapy education.  5. if patient not want to go through doublet chemotherapy, I would at least consider her for Taxotere Herceptin Perjeta followed by Herceptin maintenance and antiestrogen therapy.

## 2014-06-22 NOTE — Assessment & Plan Note (Signed)
Patient's mother was diagnosed with breast cancer at age 59. There are no other family members with cancers. Hence based on NCCN guidelines I do not believe there is an indication for genetic testing.

## 2014-06-27 ENCOUNTER — Telehealth: Payer: Self-pay | Admitting: Hematology and Oncology

## 2014-06-27 NOTE — Telephone Encounter (Signed)
S/w, pt gave appt 9/1 @ 8am.

## 2014-06-28 ENCOUNTER — Encounter (HOSPITAL_COMMUNITY): Payer: Self-pay

## 2014-06-29 ENCOUNTER — Ambulatory Visit (AMBULATORY_SURGERY_CENTER): Payer: BC Managed Care – PPO | Admitting: Internal Medicine

## 2014-06-29 ENCOUNTER — Other Ambulatory Visit: Payer: Self-pay | Admitting: Internal Medicine

## 2014-06-29 ENCOUNTER — Encounter: Payer: Self-pay | Admitting: Internal Medicine

## 2014-06-29 VITALS — BP 146/77 | HR 71 | Temp 98.3°F | Resp 22 | Ht 61.5 in | Wt 216.0 lb

## 2014-06-29 DIAGNOSIS — D128 Benign neoplasm of rectum: Secondary | ICD-10-CM

## 2014-06-29 DIAGNOSIS — D126 Benign neoplasm of colon, unspecified: Secondary | ICD-10-CM

## 2014-06-29 DIAGNOSIS — D129 Benign neoplasm of anus and anal canal: Secondary | ICD-10-CM

## 2014-06-29 DIAGNOSIS — K6289 Other specified diseases of anus and rectum: Secondary | ICD-10-CM

## 2014-06-29 DIAGNOSIS — Z1211 Encounter for screening for malignant neoplasm of colon: Secondary | ICD-10-CM

## 2014-06-29 MED ORDER — SODIUM CHLORIDE 0.9 % IV SOLN: 500.0000 mL | INTRAVENOUS | Status: DC

## 2014-06-29 NOTE — Patient Instructions (Addendum)
I found and removed 3 tiny polyps that look benign. Everything else was ok.   I will let you know pathology results and when to have another routine colonoscopy by mail.  I appreciate the opportunity to care for you. Gatha Mayer, MD, FACG     YOU HAD AN ENDOSCOPIC PROCEDURE TODAY AT [facility]: Refer to the procedure report that was given to you for any specific questions about what was found during the examination.  If the procedure report does not answer your questions, please call your gastroenterologist to clarify.  If you requested that your care partner not be given the details of your procedure findings, then the procedure report has been included in a sealed envelope for you to review at your convenience later.  YOU SHOULD EXPECT: Some feelings of bloating in the abdomen. Passage of more gas than usual.  Walking can help get rid of the air that was put into your GI tract during the procedure and reduce the bloating. If you had a lower endoscopy (such as a colonoscopy or flexible sigmoidoscopy) you may notice spotting of blood in your stool or on the toilet paper. If you underwent a bowel prep for your procedure, then you may not have a normal bowel movement for a few days.  DIET: Your first meal following the procedure should be a light meal and then it is ok to progress to your normal diet.  A half-sandwich or bowl of soup is an example of a good first meal.  Heavy or fried foods are harder to digest and may make you feel nauseous or bloated.  Likewise meals heavy in dairy and vegetables can cause extra gas to form and this can also increase the bloating.  Drink plenty of fluids but you should avoid alcoholic beverages for 24 hours.  ACTIVITY: Your care partner should take you home directly after the procedure.  You should plan to take it easy, moving slowly for the rest of the day.  You can resume normal activity the day after the procedure however you should NOT  DRIVE or use heavy machinery for 24 hours (because of the sedation medicines used during the test).    SYMPTOMS TO REPORT IMMEDIATELY: A gastroenterologist can be reached at any hour.  During normal business hours, 8:30 AM to 5:00 PM Monday through Friday, call [phone].  After hours and on weekends, please call the GI answering service at [phone] who will take a message and have the physician on call contact you.   Following lower endoscopy (colonoscopy or flexible sigmoidoscopy):  Excessive amounts of blood in the stool  Significant tenderness or worsening of abdominal pains  Swelling of the abdomen that is new, acute  Fever of 100F or higher FOLLOW UP: If any biopsies were taken you will be contacted by phone or by letter within the next 1-3 weeks.  Call your gastroenterologist if you have not heard about the biopsies in 3 weeks.  Our staff will call the home number listed on your records the next business day following your procedure to check on you and address any questions or concerns that you may have at that time regarding the information given to you following your procedure. This is a courtesy call and so if there is no answer at the home number and we have not heard from you through the emergency physician on call, we will assume that you have returned to your regular daily activities without incident.  SIGNATURES/CONFIDENTIALITY: You and/or your care partner have signed paperwork which will be entered into your electronic medical record.  These signatures attest to the fact that that the information above on your After Visit Summary has been reviewed and is understood.  Full responsibility of the confidentiality of this discharge information lies with you and/or your care-partner.    Handout was given to your care partner on polyps. You may resume your current medications today. Await biopsy results. Please call if any questions or concerns.

## 2014-06-29 NOTE — Progress Notes (Signed)
Report to PACU, RN, vss, BBS= Clear.  

## 2014-06-29 NOTE — Progress Notes (Signed)
Called to room to assist during endoscopic procedure.  Patient ID and intended procedure confirmed with present staff. Received instructions for my participation in the procedure from the performing physician.  

## 2014-06-29 NOTE — Op Note (Signed)
[place]  [address] [place], [id number]   COLONOSCOPY PROCEDURE REPORT  PATIENT: Jill Gonzalez, Jill Gonzalez  MR#: [id number] BIRTHDATE: [birth year] , 60  yrs. old GENDER: Female ENDOSCOPIST: Gatha Mayer, MD, [facility] PROCEDURE DATE:  06/29/2014 PROCEDURE:   Colonoscopy with biopsy First Screening Colonoscopy - Avg.  risk and is 50 yrs.  old or older Yes.  Prior Negative Screening - Now for repeat screening. N/A  History of Adenoma - Now for follow-up colonoscopy & has been > or = to 3 yrs.  N/A  Polyps Removed Today? Yes. ASA CLASS:   Class II INDICATIONS:average risk screening and first colonoscopy. MEDICATIONS: Propofol (Diprivan) 260 mg IV, MAC sedation, administered by CRNA, and These medications were titrated to patient response per physician's verbal order  DESCRIPTION OF PROCEDURE:   After the risks benefits and alternatives of the procedure were thoroughly explained, informed consent was obtained.  A digital rectal exam revealed no abnormalities of the rectum.   The     endoscope was introduced through the anus and advanced to the cecum, which was identified by both the appendix and ileocecal valve. No adverse events experienced.   The quality of the prep was excellent.  The instrument was then slowly withdrawn as the colon was fully examined.  COLON FINDINGS: Three diminutive sessile polyps were found at the cecum, in the transverse colon, and rectum.  A polypectomy was performed with cold forceps.  The resection was complete and the polyp tissue was completely retrieved.   The colon mucosa was otherwise normal.   A right colon retroflexion was performed. Retroflexed views revealed no abnormalities. The time to cecum=1 minutes 51 seconds.  Withdrawal time=7 minutes 42 seconds.  The scope was withdrawn and the procedure completed. COMPLICATIONS: There were no complications.  ENDOSCOPIC IMPRESSION: 1.   Three diminutive sessile polyps were found at the  cecum, in the transverse colon, and rectum; polypectomy was performed with cold forceps 2.   The colon mucosa was otherwise normal  RECOMMENDATIONS: Timing of repeat colonoscopy will be determined by pathology findings.   eSigned:  Gatha Mayer, MD, [facility] 06/29/2014 2:49 PM   cc: The Patient  and Webb Silversmith, NP

## 2014-07-02 ENCOUNTER — Ambulatory Visit: Payer: BC Managed Care – PPO

## 2014-07-02 ENCOUNTER — Telehealth: Payer: Self-pay

## 2014-07-02 ENCOUNTER — Ambulatory Visit: Payer: BC Managed Care – PPO | Admitting: Hematology and Oncology

## 2014-07-02 ENCOUNTER — Other Ambulatory Visit: Payer: BC Managed Care – PPO

## 2014-07-02 NOTE — Telephone Encounter (Signed)
Left a message at [phone] for the pt to call back if she has any questions or concerns. maw

## 2014-07-04 ENCOUNTER — Encounter: Payer: Self-pay | Admitting: *Deleted

## 2014-07-04 NOTE — Progress Notes (Signed)
Received Oncotype Dx results of 0.  Gave MD a copy and took a copy to HIM to scan.

## 2014-07-06 ENCOUNTER — Telehealth: Payer: Self-pay | Admitting: Hematology and Oncology

## 2014-07-06 NOTE — Telephone Encounter (Signed)
Pt cld to cancel labs states she spk w/Dr at visit and advised her she wouldn't have to do labs again, cancelled labs.Marland Kitchen..KJ

## 2014-07-09 ENCOUNTER — Encounter: Payer: Self-pay | Admitting: Internal Medicine

## 2014-07-09 NOTE — Progress Notes (Signed)
Quick Note:  Polyps not pre-cvancerous so repeat colonoscopy 2025 ______

## 2014-07-10 ENCOUNTER — Ambulatory Visit (HOSPITAL_BASED_OUTPATIENT_CLINIC_OR_DEPARTMENT_OTHER): Payer: BC Managed Care – PPO | Admitting: Hematology and Oncology

## 2014-07-10 ENCOUNTER — Other Ambulatory Visit: Payer: BC Managed Care – PPO

## 2014-07-10 VITALS — BP 133/74 | HR 88 | Temp 98.5°F | Resp 18 | Ht 61.0 in | Wt 211.0 lb

## 2014-07-10 DIAGNOSIS — C50519 Malignant neoplasm of lower-outer quadrant of unspecified female breast: Secondary | ICD-10-CM

## 2014-07-10 DIAGNOSIS — Z170 Estrogen receptor positive status [ER+]: Secondary | ICD-10-CM

## 2014-07-10 DIAGNOSIS — C50512 Malignant neoplasm of lower-outer quadrant of left female breast: Secondary | ICD-10-CM

## 2014-07-10 NOTE — Assessment & Plan Note (Signed)
Left breast IDC with features T1 C. N0 M0 ER/PR 100% positive HER-2 amplified ratio 2.28: Patient received chemotherapy information and the last clinic visit at which recommended chemotherapy with Herceptin. She has not decided on what she wants to do. She does not want to extensive chemotherapy but is contemplating on doing at least some treatment. I proposed to her that we can consider doing Abraxane Herceptin weekly x12 followed by Herceptin maintenance for full year as nonproductive to [facility] chemotherapy. With Abraxane, the risk of hair loss nausea vomiting and neuropathy are much lesser.  If she decides to go on treatment we will need to set her up for a port echocardiogram and chemotherapy teaching. If she decides against doing chemotherapy been she will be scheduled to undergo radiation therapy. After radiation she will then require antiestrogen therapy.  Counseled her again extensively the risks and benefits of chemotherapy and she understands the plan.

## 2014-07-10 NOTE — Progress Notes (Signed)
Patient Care Team: Jearld Fenton, NP as PCP - General (Internal Medicine) Shann Medal, MD as Consulting Physician (General Surgery) Thea Silversmith, MD as Consulting Physician (Radiation Oncology)  DIAGNOSIS: Breast cancer of lower-outer quadrant of left female breast   Primary site: Breast (Left)   Staging method: AJCC 7th Edition   Clinical: Stage 0 (Tis (DCIS), N0, cM0)   Pathologic: Stage IA (T1c, N0, cM0) signed by Rulon Eisenmenger, MD on 06/22/2014  8:06 AM   Summary: Stage IA (T1c, N0, cM0)   Clinical comments: Staged at breast conference 05/23/14   SUMMARY OF ONCOLOGIC HISTORY:   Breast cancer of lower-outer quadrant of left female breast   06/22/2012 Mammogram Left breast abnormality. Rec Biopsy but patient refused   04/27/2014 Mammogram Mass with microcalcifications   05/21/2014 Initial Diagnosis Breast cancer of lower-outer quadrant of left female breast   05/27/2014 Initial Biopsy Left Breast DCIS with papillary features;  left internal mammary lymph node benign; right breast biopsy benign   05/29/2014 Surgery Left lumpectomy and sentinel lymph node biopsy: Invasive ductal carcinoma with papillary features grade 2/3 1.8 cm with low-grade DCIS 2 sentinel lymph nodes negative PR 100% PR 99% HER-2 2.28 with gene copy #2.85 (Positive).    CHIEF COMPLIANT: Followup to discuss adjuvant therapy options  INTERVAL HISTORY: Ms. Jill Gonzalez is a 58 year old American lady with above-mentioned history of HER-2 positive left breast cancer that is ER/PR positive. I recommended adjuvant systemic therapy with chemotherapy with Herceptin. She spent time his thinking about these different options and came here today to have some more questions. She has not made up her mind on what treatment to consider. She wanted to know if Oncotype DX can be done. She also wanted to know what would happen if she decided not to undergo chemotherapy or Herceptin.   REVIEW OF SYSTEMS:   Constitutional: Denies fevers,  chills or abnormal weight loss Eyes: Denies blurriness of vision Ears, nose, mouth, throat, and face: Denies mucositis or sore throat Respiratory: Denies cough, dyspnea or wheezes Cardiovascular: Denies palpitation, chest discomfort or lower extremity swelling Gastrointestinal:  Denies nausea, heartburn or change in bowel habits Skin: Denies abnormal skin rashes Lymphatics: Denies new lymphadenopathy or easy bruising Neurological:Denies numbness, tingling or new weaknesses Behavioral/Psych: Mood is stable, no new changes  Breast:  denies any pain or lumps or nodules in either breasts. recovering from surgery quite well All other systems were reviewed with the patient and are negative.  I have reviewed the past medical history, past surgical history, social history and family history with the patient and they are unchanged from previous note.  ALLERGIES:  is allergic to sulfa antibiotics.  MEDICATIONS:  Current Outpatient Prescriptions  Medication Sig Dispense Refill  . COD LIVER OIL PO Take 2 capsules by mouth daily.      . Flaxseed, Linseed, (FLAX SEED OIL PO) Take 2 capsules by mouth daily.      . fluticasone (FLONASE) 50 MCG/ACT nasal spray Place into the nose.      Marland Kitchen ipratropium (ATROVENT) 0.06 % nasal spray Place 2 sprays into both nostrils 4 (four) times daily.      Marland Kitchen levocetirizine (XYZAL) 5 MG tablet Take by mouth.      . NON FORMULARY Take 2 capsules by mouth daily. 24/7 Solution--Sugar control      . POTASSIUM CITRATE PO Take by mouth.      Marland Kitchen HYDROcodone-acetaminophen (NORCO/VICODIN) 5-325 MG per tablet Take 1-2 tablets by mouth every 6 (six) hours as  needed.  30 tablet  0   No current facility-administered medications for this visit.    PHYSICAL EXAMINATION: ECOG PERFORMANCE STATUS: 0 - Asymptomatic  Filed Vitals:   07/10/14 0912  BP: 133/74  Pulse: 88  Temp: 98.5 F (36.9 C)  Resp: 18   Filed Weights   07/10/14 0911 07/10/14 0912  Weight: 211 lb (95.709 kg)  211 lb (95.709 kg)    GENERAL:alert, no distress and comfortable SKIN: skin color, texture, turgor are normal, no rashes or significant lesions EYES: normal, Conjunctiva are pink and non-injected, sclera clear OROPHARYNX:no exudate, no erythema and lips, buccal mucosa, and tongue normal  NECK: supple, thyroid normal size, non-tender, without nodularity LYMPH:  no palpable lymphadenopathy in the cervical, axillary or inguinal LUNGS: clear to auscultation and percussion with normal breathing effort HEART: regular rate & rhythm and no murmurs and no lower extremity edema ABDOMEN:abdomen soft, non-tender and normal bowel sounds Musculoskeletal:no cyanosis of digits and no clubbing  NEURO: alert & oriented x 3 with fluent speech, no focal motor/sensory deficits   LABORATORY DATA:  I have reviewed the data as listed   Chemistry      Component Value Date/Time   NA 143 06/22/2014 1216   NA 140 04/25/2014 1410   K 3.6 06/22/2014 1216   K 3.2* 04/25/2014 1410   CL 101 04/25/2014 1410   CO2 31* 06/22/2014 1216   CO2 34* 04/25/2014 1410   BUN 10.1 06/22/2014 1216   BUN 8 04/25/2014 1410   CREATININE 0.7 06/22/2014 1216   CREATININE 0.6 04/25/2014 1410      Component Value Date/Time   CALCIUM 9.3 06/22/2014 1216   CALCIUM 9.3 04/25/2014 1410   ALKPHOS 69 06/22/2014 1216   ALKPHOS 62 04/25/2014 1410   AST 17 06/22/2014 1216   AST 24 04/25/2014 1410   ALT 13 06/22/2014 1216   ALT 20 04/25/2014 1410   BILITOT 0.34 06/22/2014 1216   BILITOT 0.2 04/25/2014 1410       Lab Results  Component Value Date   WBC 5.2 06/22/2014   HGB 11.8 06/22/2014   HCT 36.2 06/22/2014   MCV 95.0 06/22/2014   PLT 328 06/22/2014   NEUTROABS 2.6 06/22/2014     RADIOGRAPHIC STUDIES: I have personally reviewed the radiology reports and agreed with their findings. No results found.   ASSESSMENT & PLAN:  Breast cancer of lower-outer quadrant of left female breast Left breast IDC with features T1 C. N0 M0 ER/PR 100% positive  HER-2 amplified ratio 2.28: Patient received chemotherapy information and the last clinic visit at which recommended chemotherapy with Herceptin. She has not decided on what she wants to do. She does not want to extensive chemotherapy but is contemplating on doing at least some treatment. I proposed to her that we can consider doing Abraxane Herceptin weekly x12 followed by Herceptin maintenance for full year as she is opposed to [facility] chemotherapy. With Abraxane, the risk of hair loss nausea vomiting and neuropathy are much lesser.  If she decides to go on treatment we will need to set her up for a port, echocardiogram and chemotherapy teaching. If she decides against doing chemotherapy, she will be scheduled to undergo radiation therapy. After radiation she will then require antiestrogen therapy.  Counseled her again extensively the risks and benefits of chemotherapy and she understands the plan.   No orders of the defined types were placed in this encounter.   The patient has a good understanding of the overall plan. she  agrees with it. She will call with any problems that may develop before her next visit here.  I spent 25 minutes counseling the patient face to face. The total time spent in the appointment was 30 minutes and more than 50% was on counseling and review of test results    Rulon Eisenmenger, MD 07/10/2014 10:24 AM

## 2014-07-20 ENCOUNTER — Telehealth: Payer: Self-pay

## 2014-07-20 NOTE — Telephone Encounter (Signed)
Per Dr. Lindi Adie request - follow up with patient to see if she had made a treatment decision.  Pt states she will call us Monday.   Routed to [facility]

## 2014-07-25 ENCOUNTER — Telehealth: Payer: Self-pay

## 2014-07-25 NOTE — Telephone Encounter (Signed)
Per Dr Lindi Adie, let patient know she needed to set up for radiation.  Pt states she does not want to do either treatment.  Let pt know I would let Dr. Lindi Adie know.  Pt voiced understanding. Routed to Dr Lindi Adie

## 2014-07-25 NOTE — Telephone Encounter (Signed)
Pt left msg - she does not want to have treatment.  Routed to Dr Lindi Adie.

## 2014-08-03 ENCOUNTER — Telehealth: Payer: Self-pay

## 2014-08-03 NOTE — Telephone Encounter (Signed)
Called patient to confirm status on whether she wants to discuss proceeding with radiation and she has already made up her mind that she does not desire to have chemotherapy or radiation.thankful for the call.I informed Jill Gonzalez in ct simulation and she will take off appointment schedule.

## 2014-08-07 ENCOUNTER — Telehealth: Payer: Self-pay

## 2014-08-07 NOTE — Telephone Encounter (Signed)
Per Dr Lindi Adie - contacted pt to discuss anti-estrogen treatment.  Pt is willing to come in and talk with MD about it.  Appt made for 10/7 at 345.  POF sent.

## 2014-08-08 ENCOUNTER — Telehealth: Payer: Self-pay | Admitting: Hematology and Oncology

## 2014-08-08 NOTE — Telephone Encounter (Signed)
added pt appt...per pof pt aware

## 2014-08-08 NOTE — Telephone Encounter (Signed)
Pt declined radiation or chemotherapy.  Spoke with patient 9/29 for ofc visit with Dr Lindi Adie to discuss anti-estrogen.

## 2014-08-10 ENCOUNTER — Other Ambulatory Visit: Payer: Self-pay

## 2014-08-10 ENCOUNTER — Telehealth: Payer: Self-pay | Admitting: Hematology and Oncology

## 2014-08-10 NOTE — Telephone Encounter (Signed)
, °

## 2014-08-15 ENCOUNTER — Ambulatory Visit: Payer: BC Managed Care – PPO | Admitting: Hematology and Oncology

## 2014-08-15 ENCOUNTER — Telehealth: Payer: Self-pay | Admitting: Hematology and Oncology

## 2014-08-30 ENCOUNTER — Ambulatory Visit (HOSPITAL_BASED_OUTPATIENT_CLINIC_OR_DEPARTMENT_OTHER): Payer: BC Managed Care – PPO | Admitting: Hematology and Oncology

## 2014-08-30 ENCOUNTER — Telehealth: Payer: Self-pay | Admitting: Hematology and Oncology

## 2014-08-30 VITALS — BP 135/76 | HR 75 | Temp 98.2°F | Resp 18 | Ht 61.0 in | Wt 210.7 lb

## 2014-08-30 DIAGNOSIS — C50512 Malignant neoplasm of lower-outer quadrant of left female breast: Secondary | ICD-10-CM

## 2014-08-30 DIAGNOSIS — Z170 Estrogen receptor positive status [ER+]: Secondary | ICD-10-CM

## 2014-08-30 MED ORDER — ANASTROZOLE 1 MG PO TABS: 1.0000 mg | ORAL_TABLET | Freq: Every day | ORAL | Status: DC

## 2014-08-30 NOTE — Progress Notes (Signed)
Patient Care Team: Jearld Fenton, NP as PCP - General (Internal Medicine) Alphonsa Overall, MD as Consulting Physician (General Surgery) Thea Silversmith, MD as Consulting Physician (Radiation Oncology)  DIAGNOSIS: Breast cancer of lower-outer quadrant of left female breast   Primary site: Breast (Left)   Staging method: AJCC 7th Edition   Clinical: Stage 0 (Tis (DCIS), N0, cM0)   Pathologic: Stage IA (T1c, N0, cM0) signed by Rulon Eisenmenger, MD on 06/22/2014  8:06 AM   Summary: Stage IA (T1c, N0, cM0)   Clinical comments: Staged at breast conference 05/23/14   SUMMARY OF ONCOLOGIC HISTORY:   Breast cancer of lower-outer quadrant of left female breast   06/22/2012 Mammogram Left breast abnormality. Rec Biopsy but patient refused   04/27/2014 Mammogram Mass with microcalcifications   05/21/2014 Initial Diagnosis Breast cancer of lower-outer quadrant of left female breast   05/27/2014 Initial Biopsy Left Breast DCIS with papillary features;  left internal mammary lymph node benign; right breast biopsy benign   05/29/2014 Surgery Left lumpectomy and sentinel lymph node biopsy: Invasive ductal carcinoma with papillary features grade 2/3 1.8 cm with low-grade DCIS 2 sentinel lymph nodes negative PR 100% PR 99% HER-2 2.28 with gene copy #2.85 (Positive).   07/25/2014 -  Chemotherapy Recommended chemotherapy plus Herceptin: patient refused    CHIEF COMPLIANT: Patient is here to discuss adjuvant antiestrogen therapy  INTERVAL HISTORY: Jill Gonzalez is a 58 year old white lady with above-mentioned history of left-sided breast cancer stage I K. that was ER/PR and HER-2 positive I recommended systemic chemotherapy and radiation therapy but she refused to undergo either of those 2 approaches after hearing the risks and benefits. She is yet to discuss the role of antiestrogen therapy. She is slightly sore from the previous breast surgery but otherwise feeling remarkably well. She has occasional hot flashes and  stiffness  REVIEW OF SYSTEMS:   Constitutional: Denies fevers, chills or abnormal weight loss Eyes: Denies blurriness of vision Ears, nose, mouth, throat, and face: Denies mucositis or sore throat Respiratory: Denies cough, dyspnea or wheezes Cardiovascular: Denies palpitation, chest discomfort or lower extremity swelling Gastrointestinal:  Denies nausea, heartburn or change in bowel habits Skin: Denies abnormal skin rashes Lymphatics: Denies new lymphadenopathy or easy bruising Neurological:Denies numbness, tingling or new weaknesses Behavioral/Psych: Mood is stable, no new changes  Breast: Mild left breast discomfort from previous surgery All other systems were reviewed with the patient and are negative.  I have reviewed the past medical history, past surgical history, social history and family history with the patient and they are unchanged from previous note.  ALLERGIES:  is allergic to sulfa antibiotics.  MEDICATIONS:  Current Outpatient Prescriptions  Medication Sig Dispense Refill  . COD LIVER OIL PO Take 2 capsules by mouth daily.      . Flaxseed, Linseed, (FLAX SEED OIL PO) Take 2 capsules by mouth daily.      . fluticasone (FLONASE) 50 MCG/ACT nasal spray Place into the nose.      Marland Kitchen HYDROcodone-acetaminophen (NORCO/VICODIN) 5-325 MG per tablet Take 1-2 tablets by mouth every 6 (six) hours as needed.  30 tablet  0  . ipratropium (ATROVENT) 0.06 % nasal spray Place 2 sprays into both nostrils 4 (four) times daily.      Marland Kitchen levocetirizine (XYZAL) 5 MG tablet Take by mouth.      . NON FORMULARY Take 2 capsules by mouth daily. 24/7 Solution--Sugar control      . POTASSIUM CITRATE PO Take by mouth.      Marland Kitchen  anastrozole (ARIMIDEX) 1 MG tablet Take 1 tablet (1 mg total) by mouth daily. Patient prefers Brand name drug  90 tablet  3   No current facility-administered medications for this visit.    PHYSICAL EXAMINATION: ECOG PERFORMANCE STATUS: 1 - Symptomatic but completely  ambulatory  Filed Vitals:   08/30/14 0827  BP: 135/76  Pulse: 75  Temp: 98.2 F (36.8 C)  Resp: 18   Filed Weights   08/30/14 0827  Weight: 210 lb 11.2 oz (95.573 kg)    GENERAL:alert, no distress and comfortable SKIN: skin color, texture, turgor are normal, no rashes or significant lesions EYES: normal, Conjunctiva are pink and non-injected, sclera clear OROPHARYNX:no exudate, no erythema and lips, buccal mucosa, and tongue normal  NECK: supple, thyroid normal size, non-tender, without nodularity LYMPH:  no palpable lymphadenopathy in the cervical, axillary or inguinal LUNGS: clear to auscultation and percussion with normal breathing effort HEART: regular rate & rhythm and no murmurs and no lower extremity edema ABDOMEN:abdomen soft, non-tender and normal bowel sounds Musculoskeletal:no cyanosis of digits and no clubbing  NEURO: alert & oriented x 3 with fluent speech, no focal motor/sensory deficits  LABORATORY DATA:  I have reviewed the data as listed   Chemistry      Component Value Date/Time   NA 143 06/22/2014 1216   NA 140 04/25/2014 1410   K 3.6 06/22/2014 1216   K 3.2* 04/25/2014 1410   CL 101 04/25/2014 1410   CO2 31* 06/22/2014 1216   CO2 34* 04/25/2014 1410   BUN 10.1 06/22/2014 1216   BUN 8 04/25/2014 1410   CREATININE 0.7 06/22/2014 1216   CREATININE 0.6 04/25/2014 1410      Component Value Date/Time   CALCIUM 9.3 06/22/2014 1216   CALCIUM 9.3 04/25/2014 1410   ALKPHOS 69 06/22/2014 1216   ALKPHOS 62 04/25/2014 1410   AST 17 06/22/2014 1216   AST 24 04/25/2014 1410   ALT 13 06/22/2014 1216   ALT 20 04/25/2014 1410   BILITOT 0.34 06/22/2014 1216   BILITOT 0.2 04/25/2014 1410       Lab Results  Component Value Date   WBC 5.2 06/22/2014   HGB 11.8 06/22/2014   HCT 36.2 06/22/2014   MCV 95.0 06/22/2014   PLT 328 06/22/2014   NEUTROABS 2.6 06/22/2014     RADIOGRAPHIC STUDIES: I have personally reviewed the radiology reports and agreed with their findings. No  results found.   ASSESSMENT & PLAN:  Breast cancer of lower-outer quadrant of left female breast Left breast IDC with papillary features T1 C. N0 M0 ER/PR 100% positive HER-2 amplified ratio 2.28  Patient had been previously counseled about chemotherapy options including Abraxane with Herceptin instead of [place] but she's not interested in any chemotherapy or radiation therapy. I am very concerned about the risk of local recurrence as well as systemic relapse patient. She understands the risks and benefits and this is her decision.   So today we discussed the role of anti-estrogen therapy. I recommended antiestrogen therapy with aromatase inhibitor anastrozole 1 mg daily for 5 years.  Aromatase inhibitor counseling:We discussed the risks and benefits of anti-estrogen therapy with aromatase inhibitors. These include but not limited to insomnia, hot flashes, mood changes, vaginal dryness, bone density loss, and weight gain. Although rare, serious side effects including endometrial cancer, risk of blood clots were also discussed. We strongly believe that the benefits far outweigh the risks. Patient understands these risks and consented to starting treatment. Planned treatment duration is 5  years.  We will get a baseline bone density test and I recommended that she take calcium with vitamin D. She has also been recommended to undergo annual Pap smears and GYN exams.  Survivorship:Discussed the importance of physical exercise in decreasing the likelihood of breast cancer recurrence. Recommended 30 mins daily 6 days a week of either brisk walking or cycling or swimming. Encouraged patient to eat more fruits and vegetables and decrease red meat.      Orders Placed This Encounter  Procedures  . CBC with Differential    Standing Status: Future     Number of Occurrences:      Standing Expiration Date: 08/30/2015  . Comprehensive metabolic panel (Cmet) - [facility]    Standing Status: Future     Number of  Occurrences:      Standing Expiration Date: 08/30/2015   The patient has a good understanding of the overall plan. she agrees with it. She will call with any problems that may develop before her next visit here.  I spent 20 minutes counseling the patient face to face. The total time spent in the appointment was 25 minutes and more than 50% was on counseling and review of test results    Rulon Eisenmenger, MD 08/30/2014 8:51 AM

## 2014-08-30 NOTE — Assessment & Plan Note (Signed)
Left breast IDC with papillary features T1 C. N0 M0 ER/PR 100% positive HER-2 amplified ratio 2.28  Patient had been previously counseled about chemotherapy options including Abraxane with Herceptin instead of [place] but she's not interested in any chemotherapy or radiation therapy. I am very concerned about the risk of local recurrence as well as systemic relapse patient. She understands the risks and benefits and this is her decision.   So today we discussed the role of anti-estrogen therapy. I recommended antiestrogen therapy with aromatase inhibitor anastrozole 1 mg daily for 5 years.  Aromatase inhibitor counseling:We discussed the risks and benefits of anti-estrogen therapy with aromatase inhibitors. These include but not limited to insomnia, hot flashes, mood changes, vaginal dryness, bone density loss, and weight gain. Although rare, serious side effects including endometrial cancer, risk of blood clots were also discussed. We strongly believe that the benefits far outweigh the risks. Patient understands these risks and consented to starting treatment. Planned treatment duration is 5 years.  We will get a baseline bone density test and I recommended that she take calcium with vitamin D. She has also been recommended to undergo annual Pap smears and GYN exams.  Survivorship:Discussed the importance of physical exercise in decreasing the likelihood of breast cancer recurrence. Recommended 30 mins daily 6 days a week of either brisk walking or cycling or swimming. Encouraged patient to eat more fruits and vegetables and decrease red meat.

## 2014-09-10 ENCOUNTER — Encounter: Payer: Self-pay | Admitting: Internal Medicine

## 2014-11-27 ENCOUNTER — Telehealth: Payer: Self-pay | Admitting: Hematology and Oncology

## 2014-11-27 ENCOUNTER — Ambulatory Visit (HOSPITAL_BASED_OUTPATIENT_CLINIC_OR_DEPARTMENT_OTHER): Payer: BLUE CROSS/BLUE SHIELD | Admitting: Hematology and Oncology

## 2014-11-27 ENCOUNTER — Other Ambulatory Visit (HOSPITAL_BASED_OUTPATIENT_CLINIC_OR_DEPARTMENT_OTHER): Payer: BLUE CROSS/BLUE SHIELD

## 2014-11-27 DIAGNOSIS — Z170 Estrogen receptor positive status [ER+]: Secondary | ICD-10-CM

## 2014-11-27 DIAGNOSIS — C50512 Malignant neoplasm of lower-outer quadrant of left female breast: Secondary | ICD-10-CM

## 2014-11-27 LAB — COMPREHENSIVE METABOLIC PANEL (CC13)
ALT: 15 U/L (ref 0–55)
ANION GAP: 9 meq/L (ref 3–11)
AST: 20 U/L (ref 5–34)
Albumin: 4 g/dL (ref 3.5–5.0)
Alkaline Phosphatase: 71 U/L (ref 40–150)
BUN: 9.5 mg/dL (ref 7.0–26.0)
CALCIUM: 9 mg/dL (ref 8.4–10.4)
CHLORIDE: 104 meq/L (ref 98–109)
CO2: 29 mEq/L (ref 22–29)
CREATININE: 0.7 mg/dL (ref 0.6–1.1)
EGFR: >90 mL/min/{1.73_m2} (ref >90)
Glucose: 107 mg/dl (ref 70–140)
Potassium: 3.6 mEq/L (ref 3.5–5.1)
Sodium: 143 mEq/L (ref 136–145)
TOTAL PROTEIN: 7.4 g/dL (ref 6.4–8.3)
Total Bilirubin: 0.42 mg/dL (ref 0.20–1.20)

## 2014-11-27 LAB — CBC WITH DIFFERENTIAL/PLATELET
BASO%: 0.5 % (ref 0.0–2.0)
Basophils Absolute: 0 10*3/uL (ref 0.0–0.1)
EOS%: 3.1 % (ref 0.0–7.0)
Eosinophils Absolute: 0.2 10*3/uL (ref 0.0–0.5)
HCT: 36.9 % (ref 34.8–46.6)
HGB: 12 g/dL (ref 11.6–15.9)
LYMPH%: 33 % (ref 14.0–49.7)
MCH: 31 pg (ref 25.1–34.0)
MCHC: 32.4 g/dL (ref 31.5–36.0)
MCV: 95.8 fL (ref 79.5–101.0)
MONO#: 0.7 10*3/uL (ref 0.1–0.9)
MONO%: 11.5 % (ref 0.0–14.0)
NEUT%: 51.9 % (ref 38.4–76.8)
NEUTROS ABS: 3 10*3/uL (ref 1.5–6.5)
PLATELETS: 305 10*3/uL (ref 145–400)
RBC: 3.85 10*6/uL (ref 3.70–5.45)
RDW: 13.1 % (ref 11.2–14.5)
WBC: 5.8 10*3/uL (ref 3.9–10.3)
lymph#: 1.9 10*3/uL (ref 0.9–3.3)

## 2014-11-27 NOTE — Assessment & Plan Note (Signed)
Left breast IDC with papillary features T1 C. N0 M0 ER/PR 100% positive HER-2 amplified ratio 2.28  patient refused Herceptin based chemotherapy and is currently on antiestrogen therapy with anastrozole 1 mg daily.   Anastrozole toxicities: 1.  Hot flashes 2.  Occasional breast tenderness especially when she is chocolate or coffee   Breast cancer surveillance: 1.  Breasts exam done 11/27/2014 is normal 2.  Mammogram will need to be done in July 2016   Continuing to monitor for toxicities Survivorship: encouraged her to stay active doing more physical exercise and lose weight   Return to clinic in 6 months for follow-up

## 2014-11-27 NOTE — Telephone Encounter (Signed)
, °

## 2014-11-27 NOTE — Progress Notes (Signed)
Patient Care Team: Jearld Fenton, NP as PCP - General (Internal Medicine) Alphonsa Overall, MD as Consulting Physician (General Surgery) Thea Silversmith, MD as Consulting Physician (Radiation Oncology)  DIAGNOSIS: Breast cancer of lower-outer quadrant of left female breast   Staging form: Breast, AJCC 7th Edition     Clinical: Stage 0 (Tis (DCIS), N0, cM0) - Unsigned       Staging comments: Staged at breast conference 05/23/14      Pathologic: Stage IA (T1c, N0, cM0) - Signed by Rulon Eisenmenger, MD on 06/22/2014   SUMMARY OF ONCOLOGIC HISTORY:   Breast cancer of lower-outer quadrant of left female breast   06/22/2012 Mammogram Left breast abnormality. Rec Biopsy but patient refused   04/27/2014 Mammogram Mass with microcalcifications   05/21/2014 Initial Diagnosis Breast cancer of lower-outer quadrant of left female breast   05/27/2014 Initial Biopsy Left Breast DCIS with papillary features;  left internal mammary lymph node benign; right breast biopsy benign   05/29/2014 Surgery Left lumpectomy and sentinel lymph node biopsy: Invasive ductal carcinoma with papillary features grade 2/3 1.8 cm with low-grade DCIS 2 sentinel lymph nodes negative PR 100% PR 99% HER-2 2.28 with gene copy #2.85 (Positive).   07/25/2014 -  Chemotherapy Recommended chemotherapy plus Herceptin: patient refused   07/30/2014 -  Anti-estrogen oral therapy  adjuvant anastrozole 1 mg daily    CHIEF COMPLIANT:  Follow-up on anastrozole  INTERVAL HISTORY: Jill Gonzalez is a  59 year old lady with above-mentioned history left-sided breast cancer treated with lumpectomy. She had HER-2 positive disease but she did not want to undergo chemotherapy or Herceptin. She is currently on adjuvant anastrozole 1 mg daily. She is tolerating it fairly well except for hot flashes. Denies any myalgias or any other complaints.  REVIEW OF SYSTEMS:   Constitutional: Denies fevers, chills or abnormal weight loss Eyes: Denies blurriness of  vision Ears, nose, mouth, throat, and face: Denies mucositis or sore throat Respiratory: Denies cough, dyspnea or wheezes Cardiovascular: Denies palpitation, chest discomfort or lower extremity swelling Gastrointestinal:  Denies nausea, heartburn or change in bowel habits Skin: Denies abnormal skin rashes Lymphatics: Denies new lymphadenopathy or easy bruising Neurological:Denies numbness, tingling or new weaknesses Behavioral/Psych: Mood is stable, no new changes  Breast:  Occasional pain in the breast especially when she drinks coffee or eats chocolate etc. All other systems were reviewed with the patient and are negative.  I have reviewed the past medical history, past surgical history, social history and family history with the patient and they are unchanged from previous note.  ALLERGIES:  is allergic to sulfa antibiotics.  MEDICATIONS:  Current Outpatient Prescriptions  Medication Sig Dispense Refill  . anastrozole (ARIMIDEX) 1 MG tablet Take 1 tablet (1 mg total) by mouth daily. Patient prefers Brand name drug 90 tablet 3  . COD LIVER OIL PO Take 2 capsules by mouth daily.    . Flaxseed, Linseed, (FLAX SEED OIL PO) Take 2 capsules by mouth daily.    . fluticasone (FLONASE) 50 MCG/ACT nasal spray Place into the nose.    Marland Kitchen HYDROcodone-acetaminophen (NORCO/VICODIN) 5-325 MG per tablet Take 1-2 tablets by mouth every 6 (six) hours as needed. 30 tablet 0  . ipratropium (ATROVENT) 0.06 % nasal spray Place 2 sprays into both nostrils 4 (four) times daily.    Marland Kitchen levocetirizine (XYZAL) 5 MG tablet Take by mouth.    . NON FORMULARY Take 2 capsules by mouth daily. 24/7 Solution--Sugar control    . POTASSIUM CITRATE PO Take by mouth.  No current facility-administered medications for this visit.    PHYSICAL EXAMINATION: ECOG PERFORMANCE STATUS: 1 - Symptomatic but completely ambulatory  Filed Vitals:   11/27/14 1504  BP: 138/76  Pulse: 93  Temp: 98.4 F (36.9 C)  Resp: 18    Filed Weights   11/27/14 1504  Weight: 211 lb 12.8 oz (96.072 kg)    GENERAL:alert, no distress and comfortable SKIN: skin color, texture, turgor are normal, no rashes or significant lesions EYES: normal, Conjunctiva are pink and non-injected, sclera clear OROPHARYNX:no exudate, no erythema and lips, buccal mucosa, and tongue normal  NECK: supple, thyroid normal size, non-tender, without nodularity LYMPH:  no palpable lymphadenopathy in the cervical, axillary or inguinal LUNGS: clear to auscultation and percussion with normal breathing effort HEART: regular rate & rhythm and no murmurs and no lower extremity edema ABDOMEN:abdomen soft, non-tender and normal bowel sounds Musculoskeletal:no cyanosis of digits and no clubbing  NEURO: alert & oriented x 3 with fluent speech, no focal motor/sensory deficits BREAST: No palpable masses or nodules in either right or left breasts. No palpable axillary supraclavicular or infraclavicular adenopathy no breast tenderness or nipple discharge.   LABORATORY DATA:  I have reviewed the data as listed   Chemistry      Component Value Date/Time   NA 143 11/27/2014 1450   NA 140 04/25/2014 1410   K 3.6 11/27/2014 1450   K 3.2* 04/25/2014 1410   CL 101 04/25/2014 1410   CO2 29 11/27/2014 1450   CO2 34* 04/25/2014 1410   BUN 9.5 11/27/2014 1450   BUN 8 04/25/2014 1410   CREATININE 0.7 11/27/2014 1450   CREATININE 0.6 04/25/2014 1410      Component Value Date/Time   CALCIUM 9.0 11/27/2014 1450   CALCIUM 9.3 04/25/2014 1410   ALKPHOS 71 11/27/2014 1450   ALKPHOS 62 04/25/2014 1410   AST 20 11/27/2014 1450   AST 24 04/25/2014 1410   ALT 15 11/27/2014 1450   ALT 20 04/25/2014 1410   BILITOT 0.42 11/27/2014 1450   BILITOT 0.2 04/25/2014 1410       Lab Results  Component Value Date   WBC 5.8 11/27/2014   HGB 12.0 11/27/2014   HCT 36.9 11/27/2014   MCV 95.8 11/27/2014   PLT 305 11/27/2014   NEUTROABS 3.0 11/27/2014   ASSESSMENT &  PLAN:  Breast cancer of lower-outer quadrant of left female breast Left breast IDC with papillary features T1 C. N0 M0 ER/PR 100% positive HER-2 amplified ratio 2.28  patient refused Herceptin based chemotherapy and is currently on antiestrogen therapy with anastrozole 1 mg daily.   Anastrozole toxicities: 1.  Hot flashes 2.  Occasional breast tenderness especially when she is chocolate or coffee   Breast cancer surveillance: 1.  Breasts exam done 11/27/2014 is normal 2.  Mammogram will need to be done in July 2016   Continuing to monitor for toxicities Survivorship: encouraged her to stay active doing more physical exercise and lose weight   Return to clinic in 6 months for follow-up     Orders Placed This Encounter  Procedures  . DG Bone Density    Standing Status: Future     Number of Occurrences:      Standing Expiration Date: 11/27/2015    Order Specific Question:  Reason for Exam (SYMPTOM  OR DIAGNOSIS REQUIRED)    Answer:  Osteoporosis evaluation    Order Specific Question:  Is the patient pregnant?    Answer:  No    Order Specific  Question:  Preferred imaging location?    Answer:  [facility]   The patient has a good understanding of the overall plan. she agrees with it. She will call with any problems that may develop before her next visit here.   Rulon Eisenmenger, MD

## 2014-12-07 ENCOUNTER — Other Ambulatory Visit: Payer: BLUE CROSS/BLUE SHIELD

## 2015-03-25 ENCOUNTER — Other Ambulatory Visit: Payer: Self-pay | Admitting: Hematology and Oncology

## 2015-03-25 ENCOUNTER — Other Ambulatory Visit: Payer: Self-pay | Admitting: Surgery

## 2015-03-25 DIAGNOSIS — Z853 Personal history of malignant neoplasm of breast: Secondary | ICD-10-CM

## 2015-03-25 DIAGNOSIS — Z9889 Other specified postprocedural states: Secondary | ICD-10-CM

## 2015-04-01 ENCOUNTER — Other Ambulatory Visit: Payer: Self-pay | Admitting: Internal Medicine

## 2015-04-01 DIAGNOSIS — Z0000 Encounter for general adult medical examination without abnormal findings: Secondary | ICD-10-CM

## 2015-04-02 ENCOUNTER — Other Ambulatory Visit (INDEPENDENT_AMBULATORY_CARE_PROVIDER_SITE_OTHER): Payer: BLUE CROSS/BLUE SHIELD

## 2015-04-02 DIAGNOSIS — Z0000 Encounter for general adult medical examination without abnormal findings: Secondary | ICD-10-CM

## 2015-04-02 LAB — T4, FREE: Free T4: 0.98 ng/dL (ref 0.60–1.60)

## 2015-04-02 LAB — TSH: TSH: 0.91 u[IU]/mL (ref 0.35–4.50)

## 2015-04-09 ENCOUNTER — Encounter: Payer: BLUE CROSS/BLUE SHIELD | Admitting: Internal Medicine

## 2015-04-30 ENCOUNTER — Ambulatory Visit
Admission: RE | Admit: 2015-04-30 | Discharge: 2015-04-30 | Disposition: A | Discharge status: Home / Self Care | Payer: BLUE CROSS/BLUE SHIELD | Source: Ambulatory Visit | Attending: Hematology and Oncology | Admitting: Hematology and Oncology

## 2015-04-30 DIAGNOSIS — Z9889 Other specified postprocedural states: Secondary | ICD-10-CM

## 2015-04-30 DIAGNOSIS — Z853 Personal history of malignant neoplasm of breast: Secondary | ICD-10-CM

## 2015-05-02 ENCOUNTER — Encounter: Payer: Self-pay | Admitting: Internal Medicine

## 2015-05-02 ENCOUNTER — Ambulatory Visit (INDEPENDENT_AMBULATORY_CARE_PROVIDER_SITE_OTHER): Payer: BLUE CROSS/BLUE SHIELD | Admitting: Internal Medicine

## 2015-05-02 VITALS — BP 134/72 | HR 85 | Temp 98.5°F | Ht 62.0 in | Wt 206.5 lb

## 2015-05-02 DIAGNOSIS — K219 Gastro-esophageal reflux disease without esophagitis: Secondary | ICD-10-CM

## 2015-05-02 DIAGNOSIS — E034 Atrophy of thyroid (acquired): Secondary | ICD-10-CM

## 2015-05-02 DIAGNOSIS — J302 Other seasonal allergic rhinitis: Secondary | ICD-10-CM | POA: Diagnosis not present

## 2015-05-02 DIAGNOSIS — M1990 Unspecified osteoarthritis, unspecified site: Secondary | ICD-10-CM | POA: Diagnosis not present

## 2015-05-02 DIAGNOSIS — C50512 Malignant neoplasm of lower-outer quadrant of left female breast: Secondary | ICD-10-CM

## 2015-05-02 DIAGNOSIS — Z0000 Encounter for general adult medical examination without abnormal findings: Secondary | ICD-10-CM

## 2015-05-02 DIAGNOSIS — E038 Other specified hypothyroidism: Secondary | ICD-10-CM | POA: Diagnosis not present

## 2015-05-02 LAB — COMPREHENSIVE METABOLIC PANEL
ALK PHOS: 65 U/L (ref 39–117)
ALT: 11 U/L (ref 0–35)
AST: 17 U/L (ref 0–37)
Albumin: 4 g/dL (ref 3.5–5.2)
BUN: 8 mg/dL (ref 6–23)
CALCIUM: 9.4 mg/dL (ref 8.4–10.5)
CO2: 33 mEq/L — ABNORMAL HIGH (ref 19–32)
Chloride: 102 mEq/L (ref 96–112)
Creatinine, Ser: 0.7 mg/dL (ref 0.40–1.20)
GFR: 109.75 mL/min (ref >60.00)
GLUCOSE: 101 mg/dL — AB (ref 70–99)
Potassium: 3.2 mEq/L — ABNORMAL LOW (ref 3.5–5.1)
Sodium: 139 mEq/L (ref 135–145)
TOTAL PROTEIN: 7.1 g/dL (ref 6.0–8.3)
Total Bilirubin: 0.6 mg/dL (ref 0.2–1.2)

## 2015-05-02 LAB — CBC
HCT: 36.7 % (ref 36.0–46.0)
HEMOGLOBIN: 11.9 g/dL — AB (ref 12.0–15.0)
MCHC: 32.6 g/dL (ref 30.0–36.0)
MCV: 96.2 fl (ref 78.0–100.0)
PLATELETS: 283 10*3/uL (ref 150.0–400.0)
RBC: 3.82 Mil/uL — ABNORMAL LOW (ref 3.87–5.11)
RDW: 13.8 % (ref 11.5–15.5)
WBC: 4.4 10*3/uL (ref 4.0–10.5)

## 2015-05-02 LAB — LIPID PANEL
CHOL/HDL RATIO: 3
Cholesterol: 204 mg/dL — ABNORMAL HIGH (ref 0–200)
HDL: 58.9 mg/dL (ref >39.00)
LDL CALC: 133 mg/dL — AB (ref 0–99)
NonHDL: 145.1
TRIGLYCERIDES: 61 mg/dL (ref 0.0–149.0)
VLDL: 12.2 mg/dL (ref 0.0–40.0)

## 2015-05-02 LAB — HEMOGLOBIN A1C: Hgb A1c MFr Bld: 5.2 % (ref 4.6–6.5)

## 2015-05-02 MED ORDER — OMEPRAZOLE 20 MG PO CPDR: 20.0000 mg | DELAYED_RELEASE_CAPSULE | Freq: Every day | ORAL | Status: DC

## 2015-05-02 MED ORDER — LEVOCETIRIZINE DIHYDROCHLORIDE 5 MG PO TABS: 5.0000 mg | ORAL_TABLET | Freq: Every evening | ORAL | Status: DC

## 2015-05-02 MED ORDER — IBUPROFEN 600 MG PO TABS: 600.0000 mg | ORAL_TABLET | Freq: Three times a day (TID) | ORAL | Status: DC | PRN

## 2015-05-02 NOTE — Progress Notes (Signed)
Subjective:    Patient ID: Jill Gonzalez, female    DOB: 1956-08-23, 59 y.o.   MRN: [id number]  HPI  Pt presents to the clinic today for her annual exam. She is also due for follow up of chronic conditions.  Flu: never Tetanus: 2008 Zostovax: 2008 Pap Smear: 04/2014 Mammogram: 04/2015 Bone Density: She had one scheduled but missed the appt, she will have her oncologist reschedule this for her Colon Screening: 06/2014 Vision Screening: yearly Dentist: biannually  Diet: She eats at home. She does consume some bread, pasta and meat. She does not eat a lot of fresh fruits and veggies. Exercise: She walks on a daily basis, about 1 mile.  Seasonal Allergies: Worse in the spring. She takes Xyzal and Flonase when needed. She will need a refill of the Xyzal today.  GERD: This is triggered by acidic foods. She reports this is occuring on a daily basis. She takes Tums or Rolaids as needed.  Arthritis: Mainly in her shoulders. She takes Ibuprofen as needed. She will need a refill of her Ibuprofen today.  Hypothyroid: She was on Synthroid at one point. Her oncologist stopped it. Her last TSH and Free T4 were normal 03/2015 off the medication.  Breast Cancer: S/P lumpectomy. She refused chemo and radiation. Now on Arimidex. Last mammogram 04/2015 normal.  She does follow with oncology.  Review of Systems      Past Medical History  Diagnosis Date  . Allergy   . Arthritis   . Blood transfusion without reported diagnosis 1977  . Phlebitis   . GERD (gastroesophageal reflux disease)   . Bronchitis   . Anemia   . Thyroid disease   . Hot flashes   . PONV (postoperative nausea and vomiting)     Current Outpatient Prescriptions  Medication Sig Dispense Refill  . anastrozole (ARIMIDEX) 1 MG tablet Take 1 tablet (1 mg total) by mouth daily. Patient prefers Brand name drug 90 tablet 3  . COD LIVER OIL PO Take 2 capsules by mouth daily.    . Flaxseed, Linseed, (FLAX SEED OIL PO) Take 2  capsules by mouth daily.    . fluticasone (FLONASE) 50 MCG/ACT nasal spray Place into the nose.    Marland Kitchen HYDROcodone-acetaminophen (NORCO/VICODIN) 5-325 MG per tablet Take 1-2 tablets by mouth every 6 (six) hours as needed. 30 tablet 0  . ipratropium (ATROVENT) 0.06 % nasal spray Place 2 sprays into both nostrils 4 (four) times daily.    Marland Kitchen levocetirizine (XYZAL) 5 MG tablet Take by mouth.    . NON FORMULARY Take 2 capsules by mouth daily. 24/7 Solution--Sugar control    . POTASSIUM CITRATE PO Take by mouth.     No current facility-administered medications for this visit.    Allergies  Allergen Reactions  . Sulfa Antibiotics Rash    fever    Family History  Problem Relation Age of Onset  . Arthritis Mother   . Cancer Mother 47    Breast alive age 61  . Hyperlipidemia Mother   . Heart disease Mother   . Hypertension Mother   . Stroke Brother   . Diabetes Brother   . Hypertension Sister   . Stomach cancer Father   . Colon cancer Neg Hx   . Pancreatic cancer Neg Hx   . Cancer Paternal Uncle     History   Social History  . Marital Status: Married    Spouse Name: N/A  . Number of Children: 2  . Years of Education:  N/A   Occupational History  . 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 Smoking status: Never Smoker   . Smokeless tobacco: Never Used  . Alcohol Use: No  . Drug Use: No  . Sexual Activity: Yes   Other Topics Concern  . Not on file   Social History Narrative     Constitutional: Denies fever, malaise, fatigue, headache or abrupt weight changes.  HEENT: Denies eye pain, eye redness, ear pain, ringing in the ears, wax buildup, runny nose, nasal congestion, bloody nose, or sore throat. Respiratory: Denies difficulty breathing, shortness of breath, cough or sputum production.   Cardiovascular: Denies chest pain, chest tightness, palpitations or swelling in the hands or feet.  Gastrointestinal: Denies abdominal pain, bloating,  constipation, diarrhea or blood in the stool.  GU: Denies urgency, frequency, pain with urination, burning sensation, blood in urine, odor or discharge. Musculoskeletal: Pt reports arthritis in left shoulder. Denies decrease in range of motion, difficulty with gait, muscle pain or joint swelling.  Skin: Denies redness, rashes, lesions or ulcercations.  Neurological: Denies dizziness, difficulty with memory, difficulty with speech or problems with balance and coordination.  Psych: Denies anxiety, depression, SI/HI.  No other specific complaints in a complete review of systems (except as listed in HPI above).   Objective:   Physical Exam  BP 134/72 mmHg  Pulse 85  Temp(Src) 98.5 F (36.9 C) (Oral)  Ht 5\' 2"  (1.575 m)  Wt 206 lb 8 oz (93.668 kg)  BMI 37.76 kg/m2  SpO2 98% Wt Readings from Last 3 Encounters:  05/02/15 206 lb 8 oz (93.668 kg)  11/27/14 211 lb 12.8 oz (96.072 kg)  08/30/14 210 lb 11.2 oz (95.573 kg)    General: Appears her stated age, obese in NAD. Skin: Warm, dry and intact. No rashes, lesions or ulcerations noted. HEENT: Head: normal shape and size; Eyes: sclera white, no icterus, conjunctiva pink, PERRLA and EOMs intact; Ears: Tm's gray and intact, normal light reflex; Throat/Mouth: Teeth present, mucosa pink and moist, no exudate, lesions or ulcerations noted.  Neck: Neck supple, trachea midline. No masses, lumps or thyromegaly present.  Cardiovascular: Normal rate and rhythm. S1,S2 noted.  No murmur, rubs or gallops noted. No JVD or BLE edema. No carotid bruits noted. Pulmonary/Chest: Normal effort and positive vesicular breath sounds. No respiratory distress. No wheezes, rales or ronchi noted.  Abdomen: Soft and nontender. Normal bowel sounds, no bruits noted. No distention or masses noted. Liver, spleen and kidneys non palpable. Musculoskeletal: Decreased internal and external rotation of the left shoulder secondary to pain. Strength 4/5 LUE, 5/5 RUE. Strength 5/5  BLE. No difficulty with gait.  Neurological: Alert and oriented. Cranial nerves II-XII grossly intact. Coordination normal.  Psychiatric: Mood and affect normal. Behavior is normal. Judgment and thought content normal.     BMET    Component Value Date/Time   NA 143 11/27/2014 1450   NA 140 04/25/2014 1410   K 3.6 11/27/2014 1450   K 3.2* 04/25/2014 1410   CL 101 04/25/2014 1410   CO2 29 11/27/2014 1450   CO2 34* 04/25/2014 1410   GLUCOSE 107 11/27/2014 1450   GLUCOSE 112* 04/25/2014 1410   BUN 9.5 11/27/2014 1450   BUN 8 04/25/2014 1410   CREATININE 0.7 11/27/2014 1450   CREATININE 0.6 04/25/2014 1410   CALCIUM 9.0 11/27/2014 1450   CALCIUM 9.3 04/25/2014 1410    Lipid Panel     Component Value Date/Time  CHOL 220* 04/25/2014 1410   TRIG 106.0 04/25/2014 1410   HDL 61.20 04/25/2014 1410   CHOLHDL 4 04/25/2014 1410   VLDL 21.2 04/25/2014 1410   LDLCALC 138* 04/25/2014 1410    CBC    Component Value Date/Time   WBC 5.8 11/27/2014 1450   WBC 5.2 04/25/2014 1410   RBC 3.85 11/27/2014 1450   RBC 3.78* 04/25/2014 1410   HGB 12.0 11/27/2014 1450   HGB 11.9* 04/25/2014 1410   HCT 36.9 11/27/2014 1450   HCT 35.7* 04/25/2014 1410   PLT 305 11/27/2014 1450   PLT 308.0 04/25/2014 1410   MCV 95.8 11/27/2014 1450   MCV 94.6 04/25/2014 1410   MCH 31.0 11/27/2014 1450   MCHC 32.4 11/27/2014 1450   MCHC 33.3 04/25/2014 1410   RDW 13.1 11/27/2014 1450   RDW 13.3 04/25/2014 1410   LYMPHSABS 1.9 11/27/2014 1450   MONOABS 0.7 11/27/2014 1450   EOSABS 0.2 11/27/2014 1450   BASOSABS 0.0 11/27/2014 1450    Hgb A1C Lab Results  Component Value Date   HGBA1C 5.3 04/25/2014         Assessment & Plan:   Preventative Health Maintenance:  Encouraged her to work on diet and exercise with a goal to lose about 25 lbs Will check CBC, CMET, Lipid and A1C today She declines flu shots She will schedule her pap smear with GYN She will have her oncologist reorder her bone  density exam  RTC in 1 year for annual exam/follow up

## 2015-05-02 NOTE — Patient Instructions (Addendum)
We are checking more labs today, I will send you a letter with the results Schedule your pap smear with GYN Have your oncologist set up your bone density exam I have sent a medication in to your pharmacy for reflux, called Omeprazole, take 30 minutes before breakfast Your Xyzal has been refilled  Health Maintenance Adopting a healthy lifestyle and getting preventive care can go a long way to promote health and wellness. Talk with your health care provider about what schedule of regular examinations is right for you. This is a good chance for you to check in with your provider about disease prevention and staying healthy. In between checkups, there are plenty of things you can do on your own. Experts have done a lot of research about which lifestyle changes and preventive measures are most likely to keep you healthy. Ask your health care provider for more information. WEIGHT AND DIET  Eat a healthy diet  Be sure to include plenty of vegetables, fruits, low-fat dairy products, and lean protein.  Do not eat a lot of foods high in solid fats, added sugars, or salt.  Get regular exercise. This is one of the most important things you can do for your health.  Most adults should exercise for at least 150 minutes each week. The exercise should increase your heart rate and make you sweat (moderate-intensity exercise).  Most adults should also do strengthening exercises at least twice a week. This is in addition to the moderate-intensity exercise.  Maintain a healthy weight  Body mass index (BMI) is a measurement that can be used to identify possible weight problems. It estimates body fat based on height and weight. Your health care provider can help determine your BMI and help you achieve or maintain a healthy weight.  For females 62 years of age and older:   A BMI below 18.5 is considered underweight.  A BMI of 18.5 to 24.9 is normal.  A BMI of 25 to 29.9 is considered overweight.  A BMI of  30 and above is considered obese.  Watch levels of cholesterol and blood lipids  You should start having your blood tested for lipids and cholesterol at 60 years of age, then have this test every 5 years.  You may need to have your cholesterol levels checked more often if:  Your lipid or cholesterol levels are high.  You are older than 60 years of age.  You are at high risk for heart disease.  CANCER SCREENING   Lung Cancer  Lung cancer screening is recommended for adults 43-58 years old who are at high risk for lung cancer because of a history of smoking.  A yearly low-dose CT scan of the lungs is recommended for people who:  Currently smoke.  Have quit within the past 15 years.  Have at least a 30-pack-year history of smoking. A pack year is smoking an average of one pack of cigarettes a day for 1 year.  Yearly screening should continue until it has been 15 years since you quit.  Yearly screening should stop if you develop a health problem that would prevent you from having lung cancer treatment.  Breast Cancer  Practice breast self-awareness. This means understanding how your breasts normally appear and feel.  It also means doing regular breast self-exams. Let your health care provider know about any changes, no matter how small.  If you are in your 20s or 30s, you should have a clinical breast exam (CBE) by a health care provider  every 1-3 years as part of a regular health exam.  If you are 40 or older, have a CBE every year. Also consider having a breast X-ray (mammogram) every year.  If you have a family history of breast cancer, talk to your health care provider about genetic screening.  If you are at high risk for breast cancer, talk to your health care provider about having an MRI and a mammogram every year.  Breast cancer gene (BRCA) assessment is recommended for women who have family members with BRCA-related cancers. BRCA-related cancers  include:  Breast.  Ovarian.  Tubal.  Peritoneal cancers.  Results of the assessment will determine the need for genetic counseling and BRCA1 and BRCA2 testing. Cervical Cancer Routine pelvic examinations to screen for cervical cancer are no longer recommended for nonpregnant women who are considered low risk for cancer of the pelvic organs (ovaries, uterus, and vagina) and who do not have symptoms. A pelvic examination may be necessary if you have symptoms including those associated with pelvic infections. Ask your health care provider if a screening pelvic exam is right for you.   The Pap test is the screening test for cervical cancer for women who are considered at risk.  If you had a hysterectomy for a problem that was not cancer or a condition that could lead to cancer, then you no longer need Pap tests.  If you are older than 65 years, and you have had normal Pap tests for the past 10 years, you no longer need to have Pap tests.  If you have had past treatment for cervical cancer or a condition that could lead to cancer, you need Pap tests and screening for cancer for at least 20 years after your treatment.  If you no longer get a Pap test, assess your risk factors if they change (such as having a new sexual partner). This can affect whether you should start being screened again.  Some women have medical problems that increase their chance of getting cervical cancer. If this is the case for you, your health care provider may recommend more frequent screening and Pap tests.  The human papillomavirus (HPV) test is another test that may be used for cervical cancer screening. The HPV test looks for the virus that can cause cell changes in the cervix. The cells collected during the Pap test can be tested for HPV.  The HPV test can be used to screen women 30 years of age and older. Getting tested for HPV can extend the interval between normal Pap tests from three to five years.  An HPV  test also should be used to screen women of any age who have unclear Pap test results.  After 60 years of age, women should have HPV testing as often as Pap tests.  Colorectal Cancer  This type of cancer can be detected and often prevented.  Routine colorectal cancer screening usually begins at 60 years of age and continues through 60 years of age.  Your health care provider may recommend screening at an earlier age if you have risk factors for colon cancer.  Your health care provider may also recommend using home test kits to check for hidden blood in the stool.  A small camera at the end of a tube can be used to examine your colon directly (sigmoidoscopy or colonoscopy). This is done to check for the earliest forms of colorectal cancer.  Routine screening usually begins at age 50.  Direct examination of the colon should   be repeated every 5-10 years through 60 years of age. However, you may need to be screened more often if early forms of precancerous polyps or small growths are found. Skin Cancer  Check your skin from head to toe regularly.  Tell your health care provider about any new moles or changes in moles, especially if there is a change in a mole's shape or color.  Also tell your health care provider if you have a mole that is larger than the size of a pencil eraser.  Always use sunscreen. Apply sunscreen liberally and repeatedly throughout the day.  Protect yourself by wearing long sleeves, pants, a wide-brimmed hat, and sunglasses whenever you are outside. HEART DISEASE, DIABETES, AND HIGH BLOOD PRESSURE   Have your blood pressure checked at least every 1-2 years. High blood pressure causes heart disease and increases the risk of stroke.  If you are between 55 years and 79 years old, ask your health care provider if you should take aspirin to prevent strokes.  Have regular diabetes screenings. This involves taking a blood sample to check your fasting blood sugar  level.  If you are at a normal weight and have a low risk for diabetes, have this test once every three years after 60 years of age.  If you are overweight and have a high risk for diabetes, consider being tested at a younger age or more often. PREVENTING INFECTION  Hepatitis B  If you have a higher risk for hepatitis B, you should be screened for this virus. You are considered at high risk for hepatitis B if:  You were born in a country where hepatitis B is common. Ask your health care provider which countries are considered high risk.  Your parents were born in a high-risk country, and you have not been immunized against hepatitis B (hepatitis B vaccine).  You have HIV or AIDS.  You use needles to inject street drugs.  You live with someone who has hepatitis B.  You have had sex with someone who has hepatitis B.  You get hemodialysis treatment.  You take certain medicines for conditions, including cancer, organ transplantation, and autoimmune conditions. Hepatitis C  Blood testing is recommended for:  Everyone born from 1945 through 1965.  Anyone with known risk factors for hepatitis C. Sexually transmitted infections (STIs)  You should be screened for sexually transmitted infections (STIs) including gonorrhea and chlamydia if:  You are sexually active and are younger than 60 years of age.  You are older than 60 years of age and your health care provider tells you that you are at risk for this type of infection.  Your sexual activity has changed since you were last screened and you are at an increased risk for chlamydia or gonorrhea. Ask your health care provider if you are at risk.  If you do not have HIV, but are at risk, it may be recommended that you take a prescription medicine daily to prevent HIV infection. This is called pre-exposure prophylaxis (PrEP). You are considered at risk if:  You are sexually active and do not regularly use condoms or know the HIV status  of your partner(s).  You take drugs by injection.  You are sexually active with a partner who has HIV. Talk with your health care provider about whether you are at high risk of being infected with HIV. If you choose to begin PrEP, you should first be tested for HIV. You should then be tested every 3 months for as   long as you are taking PrEP.  PREGNANCY   If you are premenopausal and you may become pregnant, ask your health care provider about preconception counseling.  If you may become pregnant, take 400 to 800 micrograms (mcg) of folic acid every day.  If you want to prevent pregnancy, talk to your health care provider about birth control (contraception). OSTEOPOROSIS AND MENOPAUSE   Osteoporosis is a disease in which the bones lose minerals and strength with aging. This can result in serious bone fractures. Your risk for osteoporosis can be identified using a bone density scan.  If you are 5 years of age or older, or if you are at risk for osteoporosis and fractures, ask your health care provider if you should be screened.  Ask your health care provider whether you should take a calcium or vitamin D supplement to lower your risk for osteoporosis.  Menopause may have certain physical symptoms and risks.  Hormone replacement therapy may reduce some of these symptoms and risks. Talk to your health care provider about whether hormone replacement therapy is right for you.  HOME CARE INSTRUCTIONS   Schedule regular health, dental, and eye exams.  Stay current with your immunizations.   Do not use any tobacco products including cigarettes, chewing tobacco, or electronic cigarettes.  If you are pregnant, do not drink alcohol.  If you are breastfeeding, limit how much and how often you drink alcohol.  Limit alcohol intake to no more than 1 drink per day for nonpregnant women. One drink equals 12 ounces of beer, 5 ounces of wine, or 1 ounces of hard liquor.  Do not use street  drugs.  Do not share needles.  Ask your health care provider for help if you need support or information about quitting drugs.  Tell your health care provider if you often feel depressed.  Tell your health care provider if you have ever been abused or do not feel safe at home. Document Released: 05/11/2011 Document Revised: 03/12/2014 Document Reviewed: 09/27/2013 [facility] Patient Information 2015 [place], [place]. This information is not intended to replace advice given to you by your health care provider. Make sure you discuss any questions you have with your health care provider.

## 2015-05-02 NOTE — Assessment & Plan Note (Signed)
TSH and Free T4 reviewed Normal off meds Will continue to monitor

## 2015-05-02 NOTE — Assessment & Plan Note (Addendum)
Controlled on Xyzal and Flonase Xyzal refilled today

## 2015-05-02 NOTE — Progress Notes (Signed)
Pre visit review using our clinic review tool, if applicable. No additional management support is needed unless otherwise documented below in the visit note. 

## 2015-05-02 NOTE — Assessment & Plan Note (Signed)
Mammogram reviewed Continue to follow with oncology Continue Arimidex

## 2015-05-02 NOTE — Assessment & Plan Note (Signed)
Discussed uncontrolled reflux and risk for esophageal cancer Will start Prilosec 20 mg daily Handout given on diet information for GERD

## 2015-05-02 NOTE — Assessment & Plan Note (Signed)
Worse in shoulder Continue Ibuprofen prn Ibuprofen refilled today

## 2015-05-20 ENCOUNTER — Other Ambulatory Visit: Payer: BLUE CROSS/BLUE SHIELD

## 2015-05-20 ENCOUNTER — Ambulatory Visit: Payer: BLUE CROSS/BLUE SHIELD | Admitting: Hematology and Oncology

## 2015-05-27 ENCOUNTER — Telehealth: Payer: Self-pay

## 2015-05-27 ENCOUNTER — Telehealth: Payer: Self-pay | Admitting: Hematology and Oncology

## 2015-05-27 NOTE — Telephone Encounter (Signed)
Pt called cancelling tomorrow's appts and requesting reschedule to Tues, Wed, or Thurs next week. This is a 6 month f/u appt.

## 2015-05-27 NOTE — Telephone Encounter (Signed)
Reschedule appointment per patient call. Patient confirmed appointment for 07/26

## 2015-05-28 ENCOUNTER — Ambulatory Visit: Payer: BLUE CROSS/BLUE SHIELD | Admitting: Hematology and Oncology

## 2015-05-28 ENCOUNTER — Other Ambulatory Visit: Payer: BLUE CROSS/BLUE SHIELD

## 2015-06-03 NOTE — Assessment & Plan Note (Signed)
Left breast IDC with papillary features T1 C. N0 M0 ER/PR 100% positive HER-2 amplified ratio 2.28 patient refused Herceptin based chemotherapy and is currently on antiestrogen therapy with anastrozole 1 mg daily.  Anastrozole toxicities: 1. Hot flashes 2. Occasional breast tenderness especially when she is chocolate or coffee  Breast cancer surveillance: 1. Breasts exam done 11/27/2014 is normal 2. Mammogram will need to be done in July 2016  Continuing to monitor for toxicities Survivorship: encouraged her to stay active doing more physical exercise and lose weight  Return to clinic in 6 months for follow-up

## 2015-06-04 ENCOUNTER — Encounter: Payer: Self-pay | Admitting: Hematology and Oncology

## 2015-06-04 ENCOUNTER — Telehealth: Payer: Self-pay | Admitting: Hematology and Oncology

## 2015-06-04 ENCOUNTER — Ambulatory Visit (HOSPITAL_BASED_OUTPATIENT_CLINIC_OR_DEPARTMENT_OTHER): Payer: BLUE CROSS/BLUE SHIELD | Admitting: Hematology and Oncology

## 2015-06-04 ENCOUNTER — Other Ambulatory Visit (HOSPITAL_BASED_OUTPATIENT_CLINIC_OR_DEPARTMENT_OTHER): Payer: BLUE CROSS/BLUE SHIELD

## 2015-06-04 VITALS — BP 140/85 | HR 76 | Temp 98.1°F | Resp 18 | Ht 62.0 in | Wt 207.0 lb

## 2015-06-04 DIAGNOSIS — C50512 Malignant neoplasm of lower-outer quadrant of left female breast: Secondary | ICD-10-CM

## 2015-06-04 DIAGNOSIS — Z79811 Long term (current) use of aromatase inhibitors: Secondary | ICD-10-CM

## 2015-06-04 DIAGNOSIS — Z803 Family history of malignant neoplasm of breast: Secondary | ICD-10-CM

## 2015-06-04 DIAGNOSIS — N951 Menopausal and female climacteric states: Secondary | ICD-10-CM

## 2015-06-04 DIAGNOSIS — Z170 Estrogen receptor positive status [ER+]: Secondary | ICD-10-CM

## 2015-06-04 LAB — CBC WITH DIFFERENTIAL/PLATELET
BASO%: 0.3 % (ref 0.0–2.0)
Basophils Absolute: 0 10*3/uL (ref 0.0–0.1)
EOS ABS: 0.1 10*3/uL (ref 0.0–0.5)
EOS%: 3.3 % (ref 0.0–7.0)
HCT: 35.9 % (ref 34.8–46.6)
HGB: 12 g/dL (ref 11.6–15.9)
LYMPH%: 36.2 % (ref 14.0–49.7)
MCH: 31.6 pg (ref 25.1–34.0)
MCHC: 33.4 g/dL (ref 31.5–36.0)
MCV: 94.5 fL (ref 79.5–101.0)
MONO#: 0.5 10*3/uL (ref 0.1–0.9)
MONO%: 12.9 % (ref 0.0–14.0)
NEUT%: 47.3 % (ref 38.4–76.8)
NEUTROS ABS: 1.9 10*3/uL (ref 1.5–6.5)
PLATELETS: 269 10*3/uL (ref 145–400)
RBC: 3.8 10*6/uL (ref 3.70–5.45)
RDW: 13 % (ref 11.2–14.5)
WBC: 4 10*3/uL (ref 3.9–10.3)
lymph#: 1.4 10*3/uL (ref 0.9–3.3)
nRBC: 0 % (ref 0–0)

## 2015-06-04 LAB — COMPREHENSIVE METABOLIC PANEL (CC13)
ALT: 15 U/L (ref 0–55)
AST: 19 U/L (ref 5–34)
Albumin: 3.9 g/dL (ref 3.5–5.0)
Alkaline Phosphatase: 73 U/L (ref 40–150)
Anion Gap: 7 mEq/L (ref 3–11)
BUN: 10.4 mg/dL (ref 7.0–26.0)
CO2: 30 mEq/L — ABNORMAL HIGH (ref 22–29)
Calcium: 9.2 mg/dL (ref 8.4–10.4)
Chloride: 107 mEq/L (ref 98–109)
Creatinine: 0.7 mg/dL (ref 0.6–1.1)
GLUCOSE: 95 mg/dL (ref 70–140)
POTASSIUM: 3.5 meq/L (ref 3.5–5.1)
Sodium: 143 mEq/L (ref 136–145)
TOTAL PROTEIN: 7.1 g/dL (ref 6.4–8.3)
Total Bilirubin: 0.47 mg/dL (ref 0.20–1.20)

## 2015-06-04 MED ORDER — ANASTROZOLE 1 MG PO TABS: 1.0000 mg | ORAL_TABLET | Freq: Every day | ORAL | Status: DC

## 2015-06-04 NOTE — Telephone Encounter (Signed)
Spoke with patient and she is aware of her follow up °

## 2015-06-04 NOTE — Progress Notes (Signed)
Patient Care Team: Jearld Fenton, NP as PCP - General (Internal Medicine) Alphonsa Overall, MD as Consulting Physician (General Surgery) Thea Silversmith, MD as Consulting Physician (Radiation Oncology)  DIAGNOSIS: Breast cancer of lower-outer quadrant of left female breast   Staging form: Breast, AJCC 7th Edition     Clinical: Stage 0 (Tis (DCIS), N0, cM0) - Unsigned       Staging comments: Staged at breast conference 05/23/14      Pathologic: Stage IA (T1c, N0, cM0) - Signed by Rulon Eisenmenger, MD on 06/22/2014   SUMMARY OF ONCOLOGIC HISTORY:   Breast cancer of lower-outer quadrant of left female breast   06/22/2012 Mammogram Left breast abnormality. Rec Biopsy but patient refused   04/27/2014 Mammogram Mass with microcalcifications   05/21/2014 Initial Diagnosis Breast cancer of lower-outer quadrant of left female breast   05/27/2014 Initial Biopsy Left Breast DCIS with papillary features;  left internal mammary lymph node benign; right breast biopsy benign   05/29/2014 Surgery Left lumpectomy and sentinel lymph node biopsy: Invasive ductal carcinoma with papillary features grade 2/3 1.8 cm with low-grade DCIS 2 sentinel lymph nodes negative PR 100% PR 99% HER-2 2.28 with gene copy #2.85 (Positive).   07/25/2014 -  Chemotherapy Recommended chemotherapy plus Herceptin: patient refused   07/30/2014 -  Anti-estrogen oral therapy  adjuvant anastrozole 1 mg daily    CHIEF COMPLIANT: follow-up on anastrozole  INTERVAL HISTORY: Jill Gonzalez is a 59 year old with above-mentioned history of left breast cancer treated with lumpectomy, HER-2 positive disease but she refused chemotherapy with Herceptin and is currently on adjuvant anastrozole 1 mg daily. She is tolerating it fairly well. She has occasional hot flashes but it appears that the hot flashes are improving over time. Recent blood work showed she had high LDL cholesterol. She reports to be staying very active through the summer by working full-time  as well as spending time with her grandchildren.  REVIEW OF SYSTEMS:   Constitutional: Denies fevers, chills or abnormal weight loss Eyes: Denies blurriness of vision Ears, nose, mouth, throat, and face: Denies mucositis or sore throat Respiratory: Denies cough, dyspnea or wheezes Cardiovascular: Denies palpitation, chest discomfort or lower extremity swelling Gastrointestinal:  Denies nausea, heartburn or change in bowel habits Skin: Denies abnormal skin rashes Lymphatics: Denies new lymphadenopathy or easy bruising Neurological:Denies numbness, tingling or new weaknesses Behavioral/Psych: Mood is stable, no new changes  Breast:  denies any pain or lumps or nodules in either breasts All other systems were reviewed with the patient and are negative.  I have reviewed the past medical history, past surgical history, social history and family history with the patient and they are unchanged from previous note.  ALLERGIES:  is allergic to sulfa antibiotics.  MEDICATIONS:  Current Outpatient Prescriptions  Medication Sig Dispense Refill  . anastrozole (ARIMIDEX) 1 MG tablet Take 1 tablet (1 mg total) by mouth daily. Patient prefers Brand name drug 90 tablet 3  . Cholecalciferol (VITAMIN D3) 5000 UNITS CAPS Take 2 capsules by mouth daily.    . Flaxseed, Linseed, (FLAX SEED OIL PO) Take 2 capsules by mouth daily.    . fluticasone (FLONASE) 50 MCG/ACT nasal spray Place into the nose.    Marland Kitchen omeprazole (PRILOSEC) 20 MG capsule Take 1 capsule (20 mg total) by mouth daily. 30 capsule 11  . POTASSIUM CITRATE PO Take by mouth.    . COD LIVER OIL PO Take 2 capsules by mouth daily.    Marland Kitchen HYDROcodone-acetaminophen (NORCO/VICODIN) 5-325 MG per tablet Take  1-2 tablets by mouth every 6 (six) hours as needed. (Patient not taking: Reported on 06/04/2015) 30 tablet 0  . ibuprofen (ADVIL,MOTRIN) 600 MG tablet Take 1 tablet (600 mg total) by mouth every 8 (eight) hours as needed. (Patient not taking: Reported on  06/04/2015) 30 tablet 5  . ipratropium (ATROVENT) 0.06 % nasal spray Place 2 sprays into both nostrils 4 (four) times daily.    Marland Kitchen levocetirizine (XYZAL) 5 MG tablet Take 1 tablet (5 mg total) by mouth every evening. (Patient not taking: Reported on 06/04/2015) 30 tablet 5  . NON FORMULARY Take 2 capsules by mouth daily. 24/7 Solution--Sugar control     No current facility-administered medications for this visit.    PHYSICAL EXAMINATION: ECOG PERFORMANCE STATUS: 0 - Asymptomatic  Filed Vitals:   06/04/15 0852  BP: 140/85  Pulse: 76  Temp: 98.1 F (36.7 C)  Resp: 18   Filed Weights   06/04/15 0852  Weight: 207 lb (93.895 kg)    GENERAL:alert, no distress and comfortable SKIN: skin color, texture, turgor are normal, no rashes or significant lesions EYES: normal, Conjunctiva are pink and non-injected, sclera clear OROPHARYNX:no exudate, no erythema and lips, buccal mucosa, and tongue normal  NECK: supple, thyroid normal size, non-tender, without nodularity LYMPH:  no palpable lymphadenopathy in the cervical, axillary or inguinal LUNGS: clear to auscultation and percussion with normal breathing effort HEART: regular rate & rhythm and no murmurs and no lower extremity edema ABDOMEN:abdomen soft, non-tender and normal bowel sounds Musculoskeletal:no cyanosis of digits and no clubbing  NEURO: alert & oriented x 3 with fluent speech, no focal motor/sensory deficits BREAST: No palpable masses or nodules in either right or left breasts. No palpable axillary supraclavicular or infraclavicular adenopathy no breast tenderness or nipple discharge. (exam performed in the presence of a chaperone)  LABORATORY DATA:  I have reviewed the data as listed   Chemistry      Component Value Date/Time   NA 139 05/02/2015 0920   NA 143 11/27/2014 1450   K 3.2* 05/02/2015 0920   K 3.6 11/27/2014 1450   CL 102 05/02/2015 0920   CO2 33* 05/02/2015 0920   CO2 29 11/27/2014 1450   BUN 8 05/02/2015 0920    BUN 9.5 11/27/2014 1450   CREATININE 0.70 05/02/2015 0920   CREATININE 0.7 11/27/2014 1450      Component Value Date/Time   CALCIUM 9.4 05/02/2015 0920   CALCIUM 9.0 11/27/2014 1450   ALKPHOS 65 05/02/2015 0920   ALKPHOS 71 11/27/2014 1450   AST 17 05/02/2015 0920   AST 20 11/27/2014 1450   ALT 11 05/02/2015 0920   ALT 15 11/27/2014 1450   BILITOT 0.6 05/02/2015 0920   BILITOT 0.42 11/27/2014 1450       Lab Results  Component Value Date   WBC 4.0 06/04/2015   HGB 12.0 06/04/2015   HCT 35.9 06/04/2015   MCV 94.5 06/04/2015   PLT 269 06/04/2015   NEUTROABS 1.9 06/04/2015     RADIOGRAPHIC STUDIES: I have personally reviewed the radiology reports and agreed with their findings. Mammogram 04/30/2015: Normal  ASSESSMENT & PLAN:  Breast cancer of lower-outer quadrant of left female breast Left breast IDC with papillary features T1 C. N0 M0 ER/PR 100% positive HER-2 amplified ratio 2.28 patient refused Herceptin based chemotherapy and is currently on antiestrogen therapy with anastrozole 1 mg daily.  Anastrozole toxicities: 1. Hot flashes 2. Occasional breast tenderness especially when she is chocolate or coffee  Breast cancer surveillance: 1.  Breasts exam done 11/27/2014 is normal 2. Mammogram 04/30/2015, postsurgical changes; breast density category B  Continuing to monitor for toxicities.  Survivorship: encouraged her to stay active doing more physical exercise and lose weight. Increased LDL cholesterol: I discussed with her about cutting down on fatty foods as well as exercising and if that doesn't work then she might need to go on cholesterol-lowering agents like statins.  Return to clinic in 6 months for follow-up   No orders of the defined types were placed in this encounter.   The patient has a good understanding of the overall plan. she agrees with it. she will call with any problems that may develop before the next visit here.   Rulon Eisenmenger,  MD

## 2015-06-28 ENCOUNTER — Other Ambulatory Visit: Payer: Self-pay

## 2015-06-28 DIAGNOSIS — K219 Gastro-esophageal reflux disease without esophagitis: Secondary | ICD-10-CM

## 2015-06-28 DIAGNOSIS — J302 Other seasonal allergic rhinitis: Secondary | ICD-10-CM

## 2015-06-28 MED ORDER — OMEPRAZOLE 20 MG PO CPDR: 20.0000 mg | DELAYED_RELEASE_CAPSULE | Freq: Every day | ORAL | Status: DC

## 2015-06-28 MED ORDER — LEVOCETIRIZINE DIHYDROCHLORIDE 5 MG PO TABS: 5.0000 mg | ORAL_TABLET | Freq: Every evening | ORAL | Status: DC

## 2015-06-28 NOTE — Telephone Encounter (Signed)
Pt requested 90 day supply instead of 30--Rx sent to pharmacy

## 2015-06-28 NOTE — Addendum Note (Signed)
Addended by: Lurlean Nanny on: 06/28/2015 03:40 PM   Modules accepted: Orders

## 2015-12-04 ENCOUNTER — Telehealth: Payer: Self-pay | Admitting: Hematology and Oncology

## 2015-12-04 NOTE — Telephone Encounter (Signed)
Patient called in to cancel her 1/25 appointment and will call back when she can reschedule

## 2015-12-05 ENCOUNTER — Ambulatory Visit: Payer: BLUE CROSS/BLUE SHIELD | Admitting: Hematology and Oncology

## 2016-02-05 ENCOUNTER — Ambulatory Visit (INDEPENDENT_AMBULATORY_CARE_PROVIDER_SITE_OTHER): Payer: 59 | Admitting: Family Medicine

## 2016-02-05 ENCOUNTER — Encounter: Payer: Self-pay | Admitting: Family Medicine

## 2016-02-05 VITALS — BP 132/78 | HR 85 | Temp 98.2°F | Ht 62.0 in | Wt 205.0 lb

## 2016-02-05 DIAGNOSIS — M654 Radial styloid tenosynovitis [de Quervain]: Secondary | ICD-10-CM

## 2016-02-05 DIAGNOSIS — M65321 Trigger finger, right index finger: Secondary | ICD-10-CM | POA: Diagnosis not present

## 2016-02-05 MED ORDER — PREDNISONE 20 MG PO TABS: ORAL_TABLET | ORAL | Status: DC

## 2016-02-05 MED ORDER — METHYLPREDNISOLONE ACETATE 40 MG/ML IJ SUSP
20.0000 mg | Freq: Once | INTRAMUSCULAR | Status: AC
  Administered 2016-02-05: 20 mg via INTRA_ARTICULAR

## 2016-02-05 NOTE — Progress Notes (Signed)
Pre visit review using our clinic review tool, if applicable. No additional management support is needed unless otherwise documented below in the visit note. 

## 2016-02-05 NOTE — Progress Notes (Signed)
Dr. Frederico Hamman T. Iraida Cragin, MD, [facility] [place], [address] Phone: [phone] Fax: [phone]  02/05/2016  Patient: Jill Gonzalez, MRN: [id number], DOB: 07/26/1956, 60 y.o.  Primary Physician:  Webb Silversmith, NP   Chief Complaint  Patient presents with  . Hand Pain    Bilateral x 3 weeks  . Wrist Pain    Left   Subjective:   Majesti Duhn is a 60 y.o. very pleasant female patient who presents with the following:  2-3 weeks ago, pain in the thumb area. Has gotten worse, then ibuprofen 600 mg tid. Then in the wrist area. 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 She has not had any specific trauma to the region.  She does do a lot of repetitive motion activities with both of her wrists.  There is a little bit of swelling on the left wrist, more in the region the first dorsal compartment and not in the true wrist.  On the right side, the second digit is triggering and having some pain in the region of the A1 pulley. \ B wrists 2 finger on the R  L DeQ, true wrist  R 2nd trigger finger injection.  Past Medical History, Surgical History, Social History, Family History, Problem List, Medications, and Allergies have been reviewed and updated if relevant.  Patient Active Problem List   Diagnosis Date Noted  . Breast cancer of lower-outer quadrant of left female breast ([facility]) 05/21/2014  . Allergic rhinitis 05/08/2014  . GERD (gastroesophageal reflux disease) 05/08/2014  . Hypothyroid 05/08/2014  . Arthritis 05/08/2014  . Family history of breast cancer 05/08/2014    Past Medical History  Diagnosis Date  . Allergy   . Arthritis   . Blood transfusion without reported diagnosis 1977  . Phlebitis   . GERD (gastroesophageal reflux disease)   . Bronchitis   . Anemia   . Thyroid disease   . Hot flashes   . PONV (postoperative nausea and vomiting)     Past Surgical History  Procedure Laterality  Date  . Cesarean section  1981  . Breast biopsy Bilateral 2011, 2012  . Tubal ligation Right 1972  . Tonsillectomy    . Cervical cone biopsy  1992  . Mouth surgery    . Breast lumpectomy      left    Social History   Social History  . Marital Status: Married    Spouse Name: N/A  . Number of Children: 2  . Years of Education: N/A   Occupational History  . 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 Smoking status: Never Smoker   . Smokeless tobacco: Never Used  . Alcohol Use: No  . Drug Use: No  . Sexual Activity: Yes   Other Topics Concern  . Not on file   Social History Narrative    Family History  Problem Relation Age of Onset  . Arthritis Mother   . Cancer Mother 20    Breast alive age 96  . Hyperlipidemia Mother   . Heart disease Mother   . Hypertension Mother   . Stroke Brother   . Diabetes Brother   . Hypertension Sister   . Stomach cancer Father   . Colon cancer Neg Hx   . Pancreatic cancer Neg Hx   . Cancer Paternal Uncle     Allergies  Allergen Reactions  . Sulfa Antibiotics Rash  fever    Medication list reviewed and updated in full in [facility].  GEN: No fevers, chills. Nontoxic. Primarily MSK c/o today. MSK: Detailed in the HPI GI: tolerating PO intake without difficulty Neuro: No numbness, parasthesias, or tingling associated. Otherwise the pertinent positives of the ROS are noted above.   Objective:   BP 132/78 mmHg  Pulse 85  Temp(Src) 98.2 F (36.8 C) (Oral)  Ht 5\' 2"  (1.575 m)  Wt 205 lb (92.987 kg)  BMI 37.49 kg/m2   GEN: WDWN, NAD, Non-toxic, Alert & Oriented x 3 HEENT: Atraumatic, Normocephalic.  Ears and Nose: No external deformity. EXTR: No clubbing/cyanosis/edema NEURO: Normal gait.  PSYCH: Normally interactive. Conversant. Not depressed or anxious appearing.  Calm demeanor.   Hand: b Ecchymosis or edema: neg ROM wrist/hand/digits/elbow: full  Carpals, MCP's, digits: NT Distal  Ulna and Radius: NT Supination lift test: neg Ecchymosis or edema: neg Cysts/nodules: triggering on the R 2nd Finkelstein's test: + on L Snuffbox tenderness: neg Scaphoid tubercle: NT Hook of Hamate: NT Resisted supination: NT Full composite fist Grip, all digits: 5/5 str No tenosynovitis Axial load test: neg Phalen's: neg Tinel's: neg Atrophy: neg  Hand sensation: intact   Radiology: No results found.  Assessment and Plan:   De Quervain's tenosynovitis, left  Trigger index finger of right hand - Plan: methylPREDNISolone acetate (DEPO-MEDROL) injection 20 mg  We reviewed the hand anatomy in [facility] and reviewed the components involved.  Ice bid for 2-3 days at a minimum Place the thumb in a thumb spica splint Hand exercises in a week or so - stress ball then opening hand with rubber bands  If continues to do poorly, may need to inject the 1st dorsal sheath  Oral pred now  We discussed the pathophysiology of trigger fingers. Discussed the inflammatory nature of nodule creation and likely nodule abutting the A1 pulley system, this causing the patient's discomfort and sensations. We discussed that treatments for this include direct injection into the tendon sheath to attempt to shrink catching tissue. This can be done 1-2 times. Other treatments include surgical release. If the patient fails to trigger finger injections, I would recommend trigger finger release if the patient desires relief of the symptoms.   Trigger Finger Injection, R 2nd Verbal consent was obtained. Risks (including rare risk of infection, potential risk for skin lightening and potential atrophy), benefits and alternatives were discussed. Prepped with Chloraprep and Ethyl Chloride used for anesthesia. Under sterile conditions, patient injected at palmar crease aiming distally with 45 degree angle towards nodule; injected directly into tendon sheath. Medication flowed freely without  resistance.  Needle size: 22 gauge 1 1/2 inch Injection: 1/2 cc of Lidocaine 1% and Depo-Medrol 20 mg   Follow-up: prn  New Prescriptions   PREDNISONE (DELTASONE) 20 MG TABLET    2 tabs po for 4 days, then 1 tab po for 3 days   Signed,  Kayin Kettering T. Jasmarie Coppock, MD   Patient's Medications  New Prescriptions   PREDNISONE (DELTASONE) 20 MG TABLET    2 tabs po for 4 days, then 1 tab po for 3 days  Previous Medications   ANASTROZOLE (ARIMIDEX) 1 MG TABLET    Take 1 tablet (1 mg total) by mouth daily. Patient prefers Brand name drug   CHOLECALCIFEROL (VITAMIN D3) 5000 UNITS CAPS    Take 2 capsules by mouth daily.   COD LIVER OIL PO    Take 2 capsules by mouth daily.   FLAXSEED, LINSEED, (FLAX  SEED OIL PO)    Take 2 capsules by mouth daily.   FLUTICASONE (FLONASE) 50 MCG/ACT NASAL SPRAY    Place into the nose.   IBUPROFEN (ADVIL,MOTRIN) 600 MG TABLET    Take 1 tablet (600 mg total) by mouth every 8 (eight) hours as needed.   IPRATROPIUM (ATROVENT) 0.06 % NASAL SPRAY    Place 2 sprays into both nostrils 4 (four) times daily.   LEVOCETIRIZINE (XYZAL) 5 MG TABLET    Take 1 tablet (5 mg total) by mouth every evening.   NON FORMULARY    Take 2 capsules by mouth daily. 24/7 Solution--Sugar control   OMEPRAZOLE (PRILOSEC) 20 MG CAPSULE    Take 1 capsule (20 mg total) by mouth daily.   POTASSIUM CITRATE PO    Take by mouth.  Modified Medications   No medications on file  Discontinued Medications   HYDROCODONE-ACETAMINOPHEN (NORCO/VICODIN) 5-325 MG PER TABLET    Take 1-2 tablets by mouth every 6 (six) hours as needed.

## 2016-02-12 ENCOUNTER — Encounter: Payer: Self-pay | Admitting: Internal Medicine

## 2016-02-12 ENCOUNTER — Ambulatory Visit (INDEPENDENT_AMBULATORY_CARE_PROVIDER_SITE_OTHER): Payer: 59 | Admitting: Internal Medicine

## 2016-02-12 VITALS — BP 132/80 | HR 92 | Temp 99.9°F | Wt 203.0 lb

## 2016-02-12 DIAGNOSIS — J069 Acute upper respiratory infection, unspecified: Secondary | ICD-10-CM

## 2016-02-12 DIAGNOSIS — B9789 Other viral agents as the cause of diseases classified elsewhere: Principal | ICD-10-CM

## 2016-02-12 LAB — POC INFLUENZA A&B (BINAX/QUICKVUE)
Influenza A, POC: NEGATIVE
Influenza B, POC: NEGATIVE

## 2016-02-12 MED ORDER — HYDROCODONE-HOMATROPINE 5-1.5 MG/5ML PO SYRP: 5.0000 mL | ORAL_SOLUTION | Freq: Three times a day (TID) | ORAL | Status: DC | PRN

## 2016-02-12 NOTE — Progress Notes (Signed)
Pre visit review using our clinic review tool, if applicable. No additional management support is needed unless otherwise documented below in the visit note. 

## 2016-02-12 NOTE — Progress Notes (Signed)
HPI  Pt presents to the clinic today with c/o cough, fever, body aches, HA and fatigue. This started yesterday. She is blowing clear mucous out of her nose. The cough is nonproductive. She is mildly short of breath. She has run a temp of 104. She has a h/o allergic rhinitis and tried Xyzal last night without relief. She has not taken anything additional OTC. She did not get her flu shot. Her sister was recently diagnosed with pneumonia.  Review of Systems     Past Medical History  Diagnosis Date  . Allergy   . Arthritis   . Blood transfusion without reported diagnosis 1977  . Phlebitis   . GERD (gastroesophageal reflux disease)   . Bronchitis   . Anemia   . Thyroid disease   . Hot flashes   . PONV (postoperative nausea and vomiting)     Family History  Problem Relation Age of Onset  . Arthritis Mother   . Cancer Mother 58    Breast alive age 29  . Hyperlipidemia Mother   . Heart disease Mother   . Hypertension Mother   . Stroke Brother   . Diabetes Brother   . Hypertension Sister   . Stomach cancer Father   . Colon cancer Neg Hx   . Pancreatic cancer Neg Hx   . Cancer Paternal Uncle     Social History   Social History  . Marital Status: Married    Spouse Name: N/A  . Number of Children: 2  . Years of Education: N/A   Occupational History  . 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 Smoking status: Never Smoker   . Smokeless tobacco: Never Used  . Alcohol Use: No  . Drug Use: No  . Sexual Activity: Yes   Other Topics Concern  . Not on file   Social History Narrative    Allergies  Allergen Reactions  . Sulfa Antibiotics Rash    fever     Constitutional: Positive headache, fatigue and fever. Denies abrupt weight changes.  HEENT:  Positive nasal congestion. Denies eye redness, eye pain, pressure behind the eyes, facial pain, ringing in the ears, wax buildup, runny nose or sore throat.  Respiratory: Positive cough and shortness of  breath. Denies difficulty breathing.  Cardiovascular: Denies chest pain, chest tightness, palpitations or swelling in the hands or feet.   No other specific complaints in a complete review of systems (except as listed in HPI above).  Objective:   BP 132/80 mmHg  Pulse 92  Temp(Src) 99.9 F (37.7 C) (Oral)  Wt 203 lb (92.08 kg)  SpO2 99% Wt Readings from Last 3 Encounters:  02/12/16 203 lb (92.08 kg)  02/05/16 205 lb (92.987 kg)  06/04/15 207 lb (93.895 kg)     General: Appears her stated age, ill appearing, in NAD. HEENT: Head: normal shape and size, no sinus tenderness noted; Eyes: sclera white, no icterus, conjunctiva pink; Ears: Tm's gray and intact, normal light reflex;  Throat/Mouth: + PND. Teeth present, mucosa pink and moist, no exudate noted, no lesions or ulcerations noted.  Neck: No lymphadenopathy.  Cardiovascular: Normal rate and rhythm. S1,S2 noted.  No murmur, rubs or gallops noted.  Pulmonary/Chest: Normal effort and positive vesicular breath sounds. No respiratory distress. No wheezes, rales or ronchi noted.      Assessment & Plan:   Viral Upper Respiratory Infection with cough:  Rapid Flu: negative Get some rest and drink plenty of water  Ibuprofen/Tylenol for fever Rx for Hycodan cough syrup Work note provided  RTC as needed or if symptoms persist.

## 2016-02-12 NOTE — Patient Instructions (Signed)

## 2016-02-18 ENCOUNTER — Ambulatory Visit (INDEPENDENT_AMBULATORY_CARE_PROVIDER_SITE_OTHER): Payer: 59 | Admitting: Internal Medicine

## 2016-02-18 ENCOUNTER — Encounter: Payer: Self-pay | Admitting: Internal Medicine

## 2016-02-18 VITALS — BP 126/72 | HR 79 | Temp 98.5°F | Wt 202.8 lb

## 2016-02-18 DIAGNOSIS — M62830 Muscle spasm of back: Secondary | ICD-10-CM | POA: Diagnosis not present

## 2016-02-18 MED ORDER — LIDOCAINE 5 % EX PTCH: 1.0000 | MEDICATED_PATCH | CUTANEOUS | Status: DC

## 2016-02-18 NOTE — Progress Notes (Signed)
Pre visit review using our clinic review tool, if applicable. No additional management support is needed unless otherwise documented below in the visit note. 

## 2016-02-18 NOTE — Patient Instructions (Signed)

## 2016-02-18 NOTE — Progress Notes (Signed)
Subjective:    Patient ID: Jill Gonzalez, female    DOB: 22-May-1956, 60 y.o.   MRN: [id number]  HPI  Pt presents to the clinic today for an urgent care follow up on 04/09/207 for back pain. She reports she had been sleeping in her recliner due to a cold last week, and her back started to bother her, increasing in severity. She denies any trauma, falls, or injury. She denies pain in her neck, or any numbness or tingling.  She was diagnosed with muscles spasms of her back, and was given a RX for Flexeril. Today,  She c/o ongoing right sided back pain from mid back up to the right shoulder. The pain has improved slightly with the Flexeril, but it makes her very drowsy. She is not taking any NSAIDs.   Review of Systems  Past Medical History  Diagnosis Date  . Allergy   . Arthritis   . Blood transfusion without reported diagnosis 1977  . Phlebitis   . GERD (gastroesophageal reflux disease)   . Bronchitis   . Anemia   . Thyroid disease   . Hot flashes   . PONV (postoperative nausea and vomiting)     Current Outpatient Prescriptions  Medication Sig Dispense Refill  . anastrozole (ARIMIDEX) 1 MG tablet Take 1 tablet (1 mg total) by mouth daily. Patient prefers Brand name drug 90 tablet 3  . Cholecalciferol (VITAMIN D3) 5000 UNITS CAPS Take 2 capsules by mouth daily.    . COD LIVER OIL PO Take 2 capsules by mouth daily.    . cyclobenzaprine (FLEXERIL) 5 MG tablet Take 1 tablet by mouth 3 (three) times daily as needed.    . Flaxseed, Linseed, (FLAX SEED OIL PO) Take 2 capsules by mouth daily.    . fluticasone (FLONASE) 50 MCG/ACT nasal spray Place into the nose.    . ibuprofen (ADVIL,MOTRIN) 600 MG tablet Take 1 tablet (600 mg total) by mouth every 8 (eight) hours as needed. 30 tablet 5  . ipratropium (ATROVENT) 0.06 % nasal spray Place 2 sprays into both nostrils 4 (four) times daily.    Marland Kitchen levocetirizine (XYZAL) 5 MG tablet Take 1 tablet (5 mg total) by mouth every evening. 90 tablet 1    . NON FORMULARY Take 2 capsules by mouth daily. 24/7 Solution--Sugar control    . omeprazole (PRILOSEC) 20 MG capsule Take 1 capsule (20 mg total) by mouth daily. 90 capsule 3  . POTASSIUM CITRATE PO Take by mouth.    . lidocaine (LIDODERM) 5 % Place 1 patch onto the skin daily. Remove & Discard patch within 12 hours or as directed by MD 30 patch 0   No current facility-administered medications for this visit.    Allergies  Allergen Reactions  . Sulfa Antibiotics Rash    fever    Family History  Problem Relation Age of Onset  . Arthritis Mother   . Cancer Mother 53    Breast alive age 35  . Hyperlipidemia Mother   . Heart disease Mother   . Hypertension Mother   . Stroke Brother   . Diabetes Brother   . Hypertension Sister   . Stomach cancer Father   . Colon cancer Neg Hx   . Pancreatic cancer Neg Hx   . Cancer Paternal Uncle     Social History   Social History  . Marital Status: Married    Spouse Name: N/A  . Number of Children: 2  . Years of Education: N/A  Occupational History  . 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 Smoking status: Never Smoker   . Smokeless tobacco: Never Used  . Alcohol Use: No  . Drug Use: No  . Sexual Activity: Yes   Other Topics Concern  . Not on file   Social History Narrative     Constitutional: Denies fever, malaise, fatigue, headache or abrupt weight changes.  Respiratory: Denies difficulty breathing, shortness of breath, or cough   Cardiovascular: Denies chest pain, chest tightness, or palpitations  Musculoskeletal: Pt reports muscle spasms. Denies decrease in range of motion, difficulty with gait, joint pain and swelling.  Neurological: Denies numbness or tingling.   No other specific complaints in a complete review of systems (except as listed in HPI above).     Objective:   Physical Exam  BP 126/72 mmHg  Pulse 79  Temp(Src) 98.5 F (36.9 C) (Oral)  Wt 202 lb 12 oz (91.967 kg)  SpO2  98% Wt Readings from Last 3 Encounters:  02/18/16 202 lb 12 oz (91.967 kg)  02/12/16 203 lb (92.08 kg)  02/05/16 205 lb (92.987 kg)    General: Appears her stated age, obese in NAD. Skin: Warm, dry and intact. No rashes, lesions or ulcerations noted. Cardiovascular: Normal rate and rhythm. S1,S2 noted.  No murmur, rubs or gallops noted. Pulmonary/Chest: Normal effort and positive vesicular breath sounds. No respiratory distress. No wheezes, rales or ronchi noted.  Musculoskeletal: Normal flexion, extension and rotation of the spine. No point tenderness over the spine. Tenderness to palpation of right thoracic back from about T12 to shoulder. Bilateral 5/5 strength of upper extremities. Neurological: Alert and oriented. Coordination normal.   BMET    Component Value Date/Time   NA 143 06/04/2015 0838   NA 139 05/02/2015 0920   K 3.5 06/04/2015 0838   K 3.2* 05/02/2015 0920   CL 102 05/02/2015 0920   CO2 30* 06/04/2015 0838   CO2 33* 05/02/2015 0920   GLUCOSE 95 06/04/2015 0838   GLUCOSE 101* 05/02/2015 0920   BUN 10.4 06/04/2015 0838   BUN 8 05/02/2015 0920   CREATININE 0.7 06/04/2015 0838   CREATININE 0.70 05/02/2015 0920   CALCIUM 9.2 06/04/2015 0838   CALCIUM 9.4 05/02/2015 0920    Lipid Panel     Component Value Date/Time   CHOL 204* 05/02/2015 0920   TRIG 61.0 05/02/2015 0920   HDL 58.90 05/02/2015 0920   CHOLHDL 3 05/02/2015 0920   VLDL 12.2 05/02/2015 0920   LDLCALC 133* 05/02/2015 0920    CBC    Component Value Date/Time   WBC 4.0 06/04/2015 0838   WBC 4.4 05/02/2015 0920   RBC 3.80 06/04/2015 0838   RBC 3.82* 05/02/2015 0920   HGB 12.0 06/04/2015 0838   HGB 11.9* 05/02/2015 0920   HCT 35.9 06/04/2015 0838   HCT 36.7 05/02/2015 0920   PLT 269 06/04/2015 0838   PLT 283.0 05/02/2015 0920   MCV 94.5 06/04/2015 0838   MCV 96.2 05/02/2015 0920   MCH 31.6 06/04/2015 0838   MCHC 33.4 06/04/2015 0838   MCHC 32.6 05/02/2015 0920   RDW 13.0 06/04/2015 0838     RDW 13.8 05/02/2015 0920   LYMPHSABS 1.4 06/04/2015 0838   MONOABS 0.5 06/04/2015 0838   EOSABS 0.1 06/04/2015 0838   BASOSABS 0.0 06/04/2015 0838    Hgb A1C Lab Results  Component Value Date   HGBA1C 5.2 05/02/2015       Assessment &  Plan:   Urgent care follow up for back muscle spasm:  Continue Flexeril at night Start Ibuprofen 600 mg TID PRN with meals Use heat/ice PRN Stretches provided in handout eRx for Lidoderm patch   RTC if syptoms persist or worsen

## 2016-03-23 ENCOUNTER — Other Ambulatory Visit: Payer: Self-pay | Admitting: Hematology and Oncology

## 2016-03-24 ENCOUNTER — Telehealth: Payer: Self-pay | Admitting: Hematology and Oncology

## 2016-03-24 ENCOUNTER — Other Ambulatory Visit: Payer: Self-pay

## 2016-03-24 DIAGNOSIS — C50512 Malignant neoplasm of lower-outer quadrant of left female breast: Secondary | ICD-10-CM

## 2016-03-24 MED ORDER — ANASTROZOLE 1 MG PO TABS: 1.0000 mg | ORAL_TABLET | Freq: Every day | ORAL | Status: DC

## 2016-03-24 NOTE — Telephone Encounter (Signed)
Received refill request for anastrozole.  Pt due for 6 mth follow up in January 2017.  Appt cancelled by pt.  Called to reschedule appt but unable to reach pt. POF entered to attempt to schedule follow up with pt.

## 2016-03-24 NOTE — Telephone Encounter (Signed)
lvm for pt regarding to 5.23 appt.... °

## 2016-03-31 ENCOUNTER — Encounter: Payer: Self-pay | Admitting: Hematology and Oncology

## 2016-03-31 ENCOUNTER — Telehealth: Payer: Self-pay | Admitting: Hematology and Oncology

## 2016-03-31 ENCOUNTER — Ambulatory Visit (HOSPITAL_BASED_OUTPATIENT_CLINIC_OR_DEPARTMENT_OTHER): Payer: 59 | Admitting: Hematology and Oncology

## 2016-03-31 VITALS — BP 136/82 | HR 79 | Temp 98.3°F | Resp 18 | Ht 62.0 in | Wt 204.8 lb

## 2016-03-31 DIAGNOSIS — N951 Menopausal and female climacteric states: Secondary | ICD-10-CM | POA: Diagnosis not present

## 2016-03-31 DIAGNOSIS — C50512 Malignant neoplasm of lower-outer quadrant of left female breast: Secondary | ICD-10-CM | POA: Diagnosis not present

## 2016-03-31 DIAGNOSIS — Z79811 Long term (current) use of aromatase inhibitors: Secondary | ICD-10-CM | POA: Diagnosis not present

## 2016-03-31 NOTE — Progress Notes (Signed)
Patient Care Team: Jearld Fenton, NP as PCP - General (Internal Medicine) Alphonsa Overall, MD as Consulting Physician (General Surgery) Thea Silversmith, MD as Consulting Physician (Radiation Oncology)  DIAGNOSIS: Breast cancer of lower-outer quadrant of left female breast [facility])   Staging form: Breast, AJCC 7th Edition     Clinical: Stage 0 (Tis (DCIS), N0, cM0) - Unsigned       Staging comments: Staged at breast conference 05/23/14      Pathologic: Stage IA (T1c, N0, cM0) - Signed by Rulon Eisenmenger, MD on 06/22/2014   SUMMARY OF ONCOLOGIC HISTORY:   Breast cancer of lower-outer quadrant of left female breast ([place])   06/22/2012 Mammogram Left breast abnormality. Rec Biopsy but patient refused   04/27/2014 Mammogram Mass with microcalcifications   05/21/2014 Initial Diagnosis Breast cancer of lower-outer quadrant of left female breast   05/27/2014 Initial Biopsy Left Breast DCIS with papillary features;  left internal mammary lymph node benign; right breast biopsy benign   05/29/2014 Surgery Left lumpectomy and sentinel lymph node biopsy: Invasive ductal carcinoma with papillary features grade 2/3 1.8 cm with low-grade DCIS 2 sentinel lymph nodes negative PR 100% PR 99% HER-2 2.28 with gene copy #2.85 (Positive).   07/25/2014 -  Chemotherapy Recommended chemotherapy plus Herceptin: patient refused   07/30/2014 -  Anti-estrogen oral therapy  adjuvant anastrozole 1 mg daily    CHIEF COMPLIANT: Follow-up on anastrozole therapy.  INTERVAL HISTORY: Jill Gonzalez is a 60-year-old with above-mentioned history left breast cancer treated with lumpectomy followed by adjuvant anastrozole. Even though she is HER-2 positive she refuses Herceptin based therapy. She denies any lumps or nodules in the breast. She is due for mammogram in June 2017. She continues to have intermittent discomfort related to caffeine with best discomfort. She is tolerating anastrozole extremely well. She does have occasional  myalgias. Denies any further problems with hot flashes.  REVIEW OF SYSTEMS:   Constitutional: Denies fevers, chills or abnormal weight loss Eyes: Denies blurriness of vision Ears, nose, mouth, throat, and face: Denies mucositis or sore throat Respiratory: Denies cough, dyspnea or wheezes Cardiovascular: Denies palpitation, chest discomfort Gastrointestinal:  Denies nausea, heartburn or change in bowel habits Skin: Denies abnormal skin rashes Lymphatics: Denies new lymphadenopathy or easy bruising Neurological:Denies numbness, tingling or new weaknesses Behavioral/Psych: Mood is stable, no new changes  Extremities: No lower extremity edema Breast:  denies any pain or lumps or nodules in either breasts All other systems were reviewed with the patient and are negative.  I have reviewed the past medical history, past surgical history, social history and family history with the patient and they are unchanged from previous note.  ALLERGIES:  is allergic to sulfa antibiotics.  MEDICATIONS:  Current Outpatient Prescriptions  Medication Sig Dispense Refill  . anastrozole (ARIMIDEX) 1 MG tablet Take 1 tablet (1 mg total) by mouth daily. Patient prefers Brand name drug 90 tablet 0  . Cholecalciferol (VITAMIN D3) 5000 UNITS CAPS Take 2 capsules by mouth daily.    . COD LIVER OIL PO Take 2 capsules by mouth daily.    . cyclobenzaprine (FLEXERIL) 5 MG tablet Take 1 tablet by mouth 3 (three) times daily as needed.    . Flaxseed, Linseed, (FLAX SEED OIL PO) Take 2 capsules by mouth daily.    . fluticasone (FLONASE) 50 MCG/ACT nasal spray Place into the nose.    . ibuprofen (ADVIL,MOTRIN) 600 MG tablet Take 1 tablet (600 mg total) by mouth every 8 (eight) hours as needed. [place]  tablet 5  . ipratropium (ATROVENT) 0.06 % nasal spray Place 2 sprays into both nostrils 4 (four) times daily.    Marland Kitchen levocetirizine (XYZAL) 5 MG tablet Take 1 tablet (5 mg total) by mouth every evening. 90 tablet 1  . lidocaine  (LIDODERM) 5 % Place 1 patch onto the skin daily. Remove & Discard patch within 12 hours or as directed by MD 30 patch 0  . NON FORMULARY Take 2 capsules by mouth daily. 24/7 Solution--Sugar control    . omeprazole (PRILOSEC) 20 MG capsule Take 1 capsule (20 mg total) by mouth daily. 90 capsule 3  . POTASSIUM CITRATE PO Take by mouth.     No current facility-administered medications for this visit.    PHYSICAL EXAMINATION: ECOG PERFORMANCE STATUS: 1 - Symptomatic but completely ambulatory  Filed Vitals:   03/31/16 0838  BP: 136/82  Pulse: 79  Temp: 98.3 F (36.8 C)  Resp: 18   Filed Weights   03/31/16 0838  Weight: 204 lb 12.8 oz (92.897 kg)    GENERAL:alert, no distress and comfortable SKIN: skin color, texture, turgor are normal, no rashes or significant lesions EYES: normal, Conjunctiva are pink and non-injected, sclera clear OROPHARYNX:no exudate, no erythema and lips, buccal mucosa, and tongue normal  NECK: supple, thyroid normal size, non-tender, without nodularity LYMPH:  no palpable lymphadenopathy in the cervical, axillary or inguinal LUNGS: clear to auscultation and percussion with normal breathing effort HEART: regular rate & rhythm and no murmurs and no lower extremity edema ABDOMEN:abdomen soft, non-tender and normal bowel sounds MUSCULOSKELETAL:no cyanosis of digits and no clubbing  NEURO: alert & oriented x 3 with fluent speech, no focal motor/sensory deficits EXTREMITIES: No lower extremity edema BREAST: No palpable masses or nodules in either right or left breasts. No palpable axillary supraclavicular or infraclavicular adenopathy no breast tenderness or nipple discharge. (exam performed in the presence of a chaperone)  LABORATORY DATA:  I have reviewed the data as listed   Chemistry      Component Value Date/Time   NA 143 06/04/2015 0838   NA 139 05/02/2015 0920   K 3.5 06/04/2015 0838   K 3.2* 05/02/2015 0920   CL 102 05/02/2015 0920   CO2 30*  06/04/2015 0838   CO2 33* 05/02/2015 0920   BUN 10.4 06/04/2015 0838   BUN 8 05/02/2015 0920   CREATININE 0.7 06/04/2015 0838   CREATININE 0.70 05/02/2015 0920      Component Value Date/Time   CALCIUM 9.2 06/04/2015 0838   CALCIUM 9.4 05/02/2015 0920   ALKPHOS 73 06/04/2015 0838   ALKPHOS 65 05/02/2015 0920   AST 19 06/04/2015 0838   AST 17 05/02/2015 0920   ALT 15 06/04/2015 0838   ALT 11 05/02/2015 0920   BILITOT 0.47 06/04/2015 0838   BILITOT 0.6 05/02/2015 0920       Lab Results  Component Value Date   WBC 4.0 06/04/2015   HGB 12.0 06/04/2015   HCT 35.9 06/04/2015   MCV 94.5 06/04/2015   PLT 269 06/04/2015   NEUTROABS 1.9 06/04/2015     ASSESSMENT & PLAN:  Breast cancer of lower-outer quadrant of left female breast Left breast IDC with papillary features T1 C. N0 M0 ER/PR 100% positive HER-2 amplified ratio 2.28 patient refused Herceptin based chemotherapy and is currently on antiestrogen therapy with anastrozole 1 mg daily since 07/30/2014.  Anastrozole toxicities: 1. Hot flashes 2. Occasional breast tenderness especially when she is chocolate or coffee  Breast cancer surveillance: 1. Breasts exam  done 03/31/2016 is normal 2. Mammogram 04/30/2015, postsurgical changes; breast density category B  Continuing to monitor for toxicities.  Survivorship: encouraged her to stay active doing more physical exercise and lose weight. Increased LDL cholesterol: I discussed with her about cutting down on fatty foods as well as exercising and if that doesn't work then she might need to go on cholesterol-lowering agents like statins.  Return to clinic in 1 year for follow-up   No orders of the defined types were placed in this encounter.   The patient has a good understanding of the overall plan. she agrees with it. she will call with any problems that may develop before the next visit here.   Rulon Eisenmenger, MD 03/31/2016

## 2016-03-31 NOTE — Telephone Encounter (Signed)
appt made and avs printed °

## 2016-03-31 NOTE — Assessment & Plan Note (Signed)
Left breast IDC with papillary features T1 C. N0 M0 ER/PR 100% positive HER-2 amplified ratio 2.28 patient refused Herceptin based chemotherapy and is currently on antiestrogen therapy with anastrozole 1 mg daily since 07/30/2014.  Anastrozole toxicities: 1. Hot flashes 2. Occasional breast tenderness especially when she is chocolate or coffee  Breast cancer surveillance: 1. Breasts exam done 03/31/2016 is normal 2. Mammogram 04/30/2015, postsurgical changes; breast density category B  Continuing to monitor for toxicities.  Survivorship: encouraged her to stay active doing more physical exercise and lose weight. Increased LDL cholesterol: I discussed with her about cutting down on fatty foods as well as exercising and if that doesn't work then she might need to go on cholesterol-lowering agents like statins.  Return to clinic in 1 year for follow-up

## 2016-04-01 ENCOUNTER — Ambulatory Visit (INDEPENDENT_AMBULATORY_CARE_PROVIDER_SITE_OTHER): Payer: 59 | Admitting: Family Medicine

## 2016-04-01 ENCOUNTER — Other Ambulatory Visit: Payer: Self-pay | Admitting: Hematology and Oncology

## 2016-04-01 ENCOUNTER — Encounter: Payer: Self-pay | Admitting: Family Medicine

## 2016-04-01 VITALS — BP 130/68 | HR 84 | Temp 98.6°F | Ht 62.0 in | Wt 206.2 lb

## 2016-04-01 DIAGNOSIS — Z853 Personal history of malignant neoplasm of breast: Secondary | ICD-10-CM

## 2016-04-01 DIAGNOSIS — M654 Radial styloid tenosynovitis [de Quervain]: Secondary | ICD-10-CM | POA: Diagnosis not present

## 2016-04-01 MED ORDER — DICLOFENAC SODIUM 75 MG PO TBEC: 75.0000 mg | DELAYED_RELEASE_TABLET | Freq: Two times a day (BID) | ORAL | Status: DC

## 2016-04-01 MED ORDER — DICLOFENAC SODIUM 1.5 % TD SOLN: TRANSDERMAL | Status: DC

## 2016-04-01 NOTE — Progress Notes (Signed)
Dr. Frederico Hamman T. Geena Weinhold, MD, [facility] [place], [address] Phone: [phone] Fax: [phone]  04/01/2016  Patient: Jill Gonzalez, MRN: [id number], DOB: [birth year], 60 y.o.  Primary Physician:  Webb Silversmith, NP   Chief Complaint  Patient presents with  . Wrist Pain    Left   Subjective:   Jill Gonzalez is a 60 y.o. very pleasant female patient who presents with the following:  De Q. She has classic de Quervain's on the left.  She has been taking oral anti-inflammatories, and she also has been using a thumb spica splint.  I previously saw her diagnosed her with such and we also did a trigger finger injection.  Somehow the patient thought that she had carpal tunnel syndrome.  She is here for further recommendations and advice.  02/05/2016 Last OV with [name], MD  2-3 weeks ago, pain in the thumb area. Has gotten worse, then ibuprofen 600 mg tid. Then in the wrist area. 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 She has not had any specific trauma to the region.  She does do a lot of repetitive motion activities with both of her wrists.  There is a little bit of swelling on the left wrist, more in the region the first dorsal compartment and not in the true wrist.  On the right side, the second digit is triggering and having some pain in the region of the A1 pulley. \ B wrists 2 finger on the R  L DeQ, true wrist  R 2nd trigger finger injection.  Past Medical History, Surgical History, Social History, Family History, Problem List, Medications, and Allergies have been reviewed and updated if relevant.  Patient Active Problem List   Diagnosis Date Noted  . Breast cancer of lower-outer quadrant of left female breast ([place]) 05/21/2014  . Allergic rhinitis 05/08/2014  . GERD (gastroesophageal reflux disease) 05/08/2014  . Hypothyroid 05/08/2014  . Arthritis 05/08/2014  . Family history of  breast cancer 05/08/2014    Past Medical History  Diagnosis Date  . Allergy   . Arthritis   . Blood transfusion without reported diagnosis 1977  . Phlebitis   . GERD (gastroesophageal reflux disease)   . Bronchitis   . Anemia   . Thyroid disease   . Hot flashes   . PONV (postoperative nausea and vomiting)     Past Surgical History  Procedure Laterality Date  . Cesarean section  1981  . Breast biopsy Bilateral 2011, 2012  . Tubal ligation Right 1972  . Tonsillectomy    . Cervical cone biopsy  1992  . Mouth surgery    . Breast lumpectomy      left    Social History   Social History  . Marital Status: Married    Spouse Name: N/A  . Number of Children: 2  . Years of Education: N/A   Occupational History  . 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 Smoking status: Never Smoker   . Smokeless tobacco: Never Used  . Alcohol Use: No  . Drug Use: No  . Sexual Activity: Yes   Other Topics Concern  . Not on file   Social History Narrative    Family History  Problem Relation Age of Onset  . Arthritis Mother   . Cancer Mother 69    Breast alive age 54  . Hyperlipidemia Mother   . Heart disease Mother   .  Hypertension Mother   . Stroke Brother   . Diabetes Brother   . Hypertension Sister   . Stomach cancer Father   . Colon cancer Neg Hx   . Pancreatic cancer Neg Hx   . Cancer Paternal Uncle     Allergies  Allergen Reactions  . Sulfa Antibiotics Rash    fever    Medication list reviewed and updated in full in [place].  GEN: No fevers, chills. Nontoxic. Primarily MSK c/o today. MSK: Detailed in the HPI GI: tolerating PO intake without difficulty Neuro: No numbness, parasthesias, or tingling associated. Otherwise the pertinent positives of the ROS are noted above.   Objective:   BP 130/68 mmHg  Pulse 84  Temp(Src) 98.6 F (37 C) (Oral)  Ht 5\' 2"  (1.575 m)  Wt 206 lb 4 oz (93.554 kg)  BMI 37.71 kg/m2   GEN:  WDWN, NAD, Non-toxic, Alert & Oriented x 3 HEENT: Atraumatic, Normocephalic.  Ears and Nose: No external deformity. EXTR: No clubbing/cyanosis/edema NEURO: Normal gait.  PSYCH: Normally interactive. Conversant. Not depressed or anxious appearing.  Calm demeanor.   Hand: b Ecchymosis or edema: neg ROM wrist/hand/digits/elbow: full  Carpals, MCP's, digits: NT Distal Ulna and Radius: NT Supination lift test: neg Ecchymosis or edema: neg Cysts/nodules: none Finkelstein's test: + on L Snuffbox tenderness: neg Scaphoid tubercle: NT Hook of [place]: NT Resisted supination: NT Full composite fist Grip, all digits: 5/5 str No tenosynovitis Axial load test: neg Phalen's: neg Tinel's: neg Atrophy: neg  Hand sensation: intact   Radiology: No results found.  Assessment and Plan:   De Quervain's tenosynovitis, left - Plan: Ambulatory referral to Physical Therapy  Using an anatomical model, I reviewed with the patient the structures involved and how they related to their diagnosis . I did my best.  First dorsal compartment tenosynovitis on the left. Change to prescription Voltaren as well as pennsaid topical.   We had a long conversation regarding potentially a first dorsal compartment sheath injection versus other options.  Also reviewed potential blanching of the skin as a side effect.  While uncommon, we discussed that this can happen, and the patient at this point is not willing to accept that risk.  I counseled her that doing some Decadron iontophoresis and physical therapy I think would likely be of great benefit.  Follow-up: 6 weeks  New Prescriptions   DICLOFENAC (VOLTAREN) 75 MG EC TABLET    Take 1 tablet (75 mg total) by mouth 2 (two) times daily.   DICLOFENAC SODIUM 1.5 % SOLN    Apply to wrist as directed   Orders Placed This Encounter  Procedures  . Ambulatory referral to Physical Therapy    Signed,  Frederico Hamman T. Tyneka Scafidi, MD   Patient's Medications  New  Prescriptions   DICLOFENAC (VOLTAREN) 75 MG EC TABLET    Take 1 tablet (75 mg total) by mouth 2 (two) times daily.   DICLOFENAC SODIUM 1.5 % SOLN    Apply to wrist as directed  Previous Medications   ANASTROZOLE (ARIMIDEX) 1 MG TABLET    Take 1 tablet (1 mg total) by mouth daily. Patient prefers Brand name drug   CHOLECALCIFEROL (VITAMIN D3) 5000 UNITS CAPS    Take 2 capsules by mouth daily.   FLAXSEED, LINSEED, (FLAX SEED OIL PO)    Take 2 capsules by mouth daily.   FLUTICASONE (FLONASE) 50 MCG/ACT NASAL SPRAY    Place into the nose.   IBUPROFEN (ADVIL,MOTRIN) 600 MG TABLET  Take 1 tablet (600 mg total) by mouth every 8 (eight) hours as needed.   LEVOCETIRIZINE (XYZAL) 5 MG TABLET    Take 1 tablet (5 mg total) by mouth every evening.   LIDOCAINE (LIDODERM) 5 %    Place 1 patch onto the skin daily. Remove & Discard patch within 12 hours or as directed by MD   NON FORMULARY    Take 2 capsules by mouth daily. 24/7 Solution--Sugar control   OMEPRAZOLE (PRILOSEC) 20 MG CAPSULE    Take 1 capsule (20 mg total) by mouth daily.   POTASSIUM CITRATE PO    Take by mouth.  Modified Medications   No medications on file  Discontinued Medications   No medications on file

## 2016-04-01 NOTE — Patient Instructions (Signed)

## 2016-04-01 NOTE — Progress Notes (Signed)
Pre visit review using our clinic review tool, if applicable. No additional management support is needed unless otherwise documented below in the visit note. 

## 2016-04-07 ENCOUNTER — Telehealth: Payer: Self-pay | Admitting: *Deleted

## 2016-04-07 NOTE — Telephone Encounter (Signed)
Received fax from [facility] requesting PA for Diclofenac 1.5% solution or send Rx for Volteran Gel (which will also require PA).  PA forms requested and placed in Dr. Lillie Fragmin in box to complete.

## 2016-04-08 MED ORDER — VOLTAREN 1 % TD GEL: 2.0000 g | Freq: Four times a day (QID) | TRANSDERMAL | Status: DC | PRN

## 2016-04-08 NOTE — Telephone Encounter (Signed)
Will change go Voltaren Gel per Dr. Lorelei Pont.  Prescription sent to [facility] in [place].

## 2016-04-08 NOTE — Addendum Note (Signed)
Addended by: Carter Kitten on: 04/08/2016 05:03 PM   Modules accepted: Orders

## 2016-04-14 ENCOUNTER — Other Ambulatory Visit: Payer: Self-pay | Admitting: Internal Medicine

## 2016-04-30 ENCOUNTER — Ambulatory Visit
Admission: RE | Admit: 2016-04-30 | Discharge: 2016-04-30 | Disposition: A | Discharge status: Home / Self Care | Payer: 59 | Source: Ambulatory Visit | Attending: Hematology and Oncology | Admitting: Hematology and Oncology

## 2016-04-30 DIAGNOSIS — Z853 Personal history of malignant neoplasm of breast: Secondary | ICD-10-CM

## 2016-05-06 ENCOUNTER — Ambulatory Visit: Payer: 59 | Admitting: Family Medicine

## 2016-05-13 ENCOUNTER — Encounter: Payer: Self-pay | Admitting: Family Medicine

## 2016-05-13 ENCOUNTER — Ambulatory Visit (INDEPENDENT_AMBULATORY_CARE_PROVIDER_SITE_OTHER)
Admission: RE | Admit: 2016-05-13 | Discharge: 2016-05-13 | Disposition: A | Discharge status: Home / Self Care | Payer: 59 | Source: Ambulatory Visit | Attending: Family Medicine | Admitting: Family Medicine

## 2016-05-13 ENCOUNTER — Ambulatory Visit (INDEPENDENT_AMBULATORY_CARE_PROVIDER_SITE_OTHER): Payer: 59 | Admitting: Family Medicine

## 2016-05-13 VITALS — BP 130/72 | HR 74 | Temp 98.3°F | Ht 62.0 in | Wt 206.0 lb

## 2016-05-13 DIAGNOSIS — M654 Radial styloid tenosynovitis [de Quervain]: Secondary | ICD-10-CM

## 2016-05-13 DIAGNOSIS — M25511 Pain in right shoulder: Secondary | ICD-10-CM

## 2016-05-13 DIAGNOSIS — R29898 Other symptoms and signs involving the musculoskeletal system: Secondary | ICD-10-CM | POA: Diagnosis not present

## 2016-05-13 MED ORDER — METHYLPREDNISOLONE ACETATE 40 MG/ML IJ SUSP
20.0000 mg | Freq: Once | INTRAMUSCULAR | Status: AC
  Administered 2016-05-13: 20 mg via INTRA_ARTICULAR

## 2016-05-13 NOTE — Patient Instructions (Signed)

## 2016-05-13 NOTE — Progress Notes (Signed)
Dr. Frederico Hamman T. Kayslee Furey, MD, [facility] [place], [address] Phone: [phone] Fax: [phone]  05/13/2016  Patient: Jill Gonzalez, MRN: [id number], DOB: [birth year], 60 y.o.  Primary Physician:  Webb Silversmith, NP   Chief Complaint  Patient presents with  . Follow-up    Fast Med-Right Shoulder   Subjective:   Jill Gonzalez is a 60 y.o. very pleasant female patient who presents with the following:  Was moving a chair, recliner in the room at the hospital. [facility] chair from one side to another. Pulled  Up in the bed. Patient with multiple chairs, she also lifted her mother while she was in the hospital. After then, she has hardly been able to use her right shoulder, and she is unable to abduct or flex her shoulder. She has minimal muscle function, and this occurred after this known injury. This occurred approximately for 5 days ago.  She presented yesterday to an urgent care center, and they referred her back to me for follow-up. She currently is taking some Naprosyn, additionally she is taking some baclofen.  She continues to have worsening pain of the de Quervain's that I saw her for some time ago. She has been doing some rehabilitation and traditional conservative management for this without much success.  Past Medical History, Surgical History, Social History, Family History, Problem List, Medications, and Allergies have been reviewed and updated if relevant.  Patient Active Problem List   Diagnosis Date Noted  . Breast cancer of lower-outer quadrant of left female breast ([place]) 05/21/2014  . Allergic rhinitis 05/08/2014  . GERD (gastroesophageal reflux disease) 05/08/2014  . Hypothyroid 05/08/2014  . Arthritis 05/08/2014  . Family history of breast cancer 05/08/2014    Past Medical History  Diagnosis Date  . Allergy   . Arthritis   . Blood transfusion without reported diagnosis 1977  . Phlebitis   . GERD  (gastroesophageal reflux disease)   . Bronchitis   . Anemia   . Thyroid disease   . Hot flashes   . PONV (postoperative nausea and vomiting)     Past Surgical History  Procedure Laterality Date  . Cesarean section  1981  . Breast biopsy Bilateral 2011, 2012  . Tubal ligation Right 1972  . Tonsillectomy    . Cervical cone biopsy  1992  . Mouth surgery    . Breast lumpectomy      left    Social History   Social History  . Marital Status: Married    Spouse Name: N/A  . Number of Children: 2  . Years of Education: N/A   Occupational History  . 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 Smoking status: Never Smoker   . Smokeless tobacco: Never Used  . Alcohol Use: No  . Drug Use: No  . Sexual Activity: Yes   Other Topics Concern  . Not on file   Social History Narrative    Family History  Problem Relation Age of Onset  . Arthritis Mother   . Cancer Mother 87    Breast alive age 58  . Hyperlipidemia Mother   . Heart disease Mother   . Hypertension Mother   . Stroke Brother   . Diabetes Brother   . Hypertension Sister   . Stomach cancer Father   . Colon cancer Neg Hx   . Pancreatic cancer Neg Hx   . Cancer Paternal Uncle  Allergies  Allergen Reactions  . Sulfa Antibiotics Rash    fever    Medication list reviewed and updated in full in [place].  GEN: No fevers, chills. Nontoxic. Primarily MSK c/o today. MSK: Detailed in the HPI GI: tolerating PO intake without difficulty Neuro: No numbness, parasthesias, or tingling associated. Otherwise the pertinent positives of the ROS are noted above.   Objective:   BP 130/72 mmHg  Pulse 74  Temp(Src) 98.3 F (36.8 C) (Oral)  Ht 5\' 2"  (1.575 m)  Wt 206 lb (93.441 kg)  BMI 37.67 kg/m2   GEN: WDWN, NAD, Non-toxic, Alert & Oriented x 3 HEENT: Atraumatic, Normocephalic.  Ears and Nose: No external deformity. EXTR: No clubbing/cyanosis/edema NEURO: Normal gait.  PSYCH:  Normally interactive. Conversant. Not depressed or anxious appearing.  Calm demeanor.   Shoulder: R Inspection: No muscle wasting or winging Ecchymosis/edema: neg  AC joint, scapula, clavicle: TTP at [facility] joint Cervical spine: NT, full ROM Spurling's: neg Abduction: unable to lift beyond 20 deg, 1/5 Flexion: , 1/5 IR, full, lift-off: 4/5 ER at neutral: full, 3/5 Drop test is positive Other special testing is equivocal. C5-T1 intact  Neuro: Sensation intact Grip 4/5   Hand: L Ecchymosis or edema: neg ROM wrist/hand/digits/elbow: full  Carpals, MCP's, digits: NT Distal Ulna and Radius: NT Supination lift test: neg Ecchymosis or edema: neg Cysts/nodules: neg Finkelstein's test: POS Snuffbox tenderness: neg Scaphoid tubercle: NT Hook of Hamate: NT Resisted supination: NT Full composite fist Grip, all digits: 4/5 str No tenosynovitis Axial load test: neg Phalen's: neg Tinel's: neg Atrophy: neg  Hand sensation: intact   Radiology: Dg Shoulder Right  05/13/2016  CLINICAL DATA:  Right shoulder pain for 2 days. Limited range of motion. No known injury. EXAM: RIGHT SHOULDER - 2+ VIEW COMPARISON:  None. FINDINGS: There is no evidence of fracture or dislocation. Generalized osteopenia noted. Mild degenerative hypertrophy seen involving the acromioclavicular joint. IMPRESSION: No acute findings. Mild acromioclavicular DJD and osteopenia. Electronically Signed   By: Earle Gell M.D.   On: 05/13/2016 17:26    Assessment and Plan:   Right shoulder pain - Plan: DG Shoulder Right, MR Shoulder Right Wo Contrast  Right arm weakness - Plan: MR Shoulder Right Wo Contrast  De Quervain's tenosynovitis, left - Plan: methylPREDNISolone acetate (DEPO-MEDROL) injection 20 mg  High level of clinical concern for full-thickness rotator cuff tear on the right with minimal activation of rotator cuff in the plane of abduction and in flexion. Obtain MRI of the right shoulder to evaluate for  full-thickness versus high grade partial thickness rotator cuff tear. Drop test is positive.  Continues to have clinically significant de Quervain's tenosynovitis on the left. Reviewed traditional management as well. We are going to do a corticosteroid injection to try to give her some relief of the left hand, the opposite affected hand of her right shoulder.  DeQuervain's Tenosynovitis Injection, LEFT Verbal consent was obtained. Risks (including rare risk of infection, risk of skin atrophy, risk of skin bleaching), benefits, and alternatives were discussed. Prepped with Chloraprep and Ethyl Chloride used for anesthesia. Under sterile conditions, after tendons identified patient injected 1 1/2 cm proximal to radial styloid. Aspiration yields no blood. Decreased pain post injection. No complications. Needle size: 22 gauge Injection: 1/2 cc of Lidocaine 1% and Depo-Medrol 20 mg   Follow-up: depending on MRI results  New Prescriptions   DIAZEPAM (VALIUM) 5 MG TABLET    Take one tablets by mouth 30 minutes prior to MRI  Orders Placed This Encounter  Procedures  . DG Shoulder Right  . MR Shoulder Right Wo Contrast    Signed,  Deidre Carino T. Nada Godley, MD   Patient's Medications  New Prescriptions   DIAZEPAM (VALIUM) 5 MG TABLET    Take one tablets by mouth 30 minutes prior to MRI  Previous Medications   ANASTROZOLE (ARIMIDEX) 1 MG TABLET    Take 1 tablet (1 mg total) by mouth daily. Patient prefers Brand name drug   BACLOFEN (LIORESAL) 10 MG TABLET       CHOLECALCIFEROL (VITAMIN D3) 5000 UNITS CAPS    Take 2 capsules by mouth daily.   DICLOFENAC (VOLTAREN) 75 MG EC TABLET    Take 1 tablet (75 mg total) by mouth 2 (two) times daily.   FLAXSEED, LINSEED, (FLAX SEED OIL PO)    Take 2 capsules by mouth daily.   FLUTICASONE (FLONASE) 50 MCG/ACT NASAL SPRAY    Place into the nose.   IBUPROFEN (ADVIL,MOTRIN) 600 MG TABLET    Take 1 tablet (600 mg total) by mouth every 8 (eight) hours as needed.    LEVOCETIRIZINE (XYZAL) 5 MG TABLET    Take 1 tablet (5 mg total) by mouth every evening. PLEASE SCHEDULE ANNUAL EXAM   LIDOCAINE (LIDODERM) 5 %    Place 1 patch onto the skin daily. Remove & Discard patch within 12 hours or as directed by MD   NAPROXEN SODIUM (ANAPROX) 550 MG TABLET       NON FORMULARY    Take 2 capsules by mouth daily. 24/7 Solution--Sugar control   OMEPRAZOLE (PRILOSEC) 20 MG CAPSULE    Take 1 capsule (20 mg total) by mouth daily.   POTASSIUM CITRATE PO    Take by mouth.   VOLTAREN 1 % GEL    Apply 2 g topically 4 (four) times daily as needed.  Modified Medications   No medications on file  Discontinued Medications   No medications on file

## 2016-05-13 NOTE — Progress Notes (Signed)
Pre visit review using our clinic review tool, if applicable. No additional management support is needed unless otherwise documented below in the visit note. 

## 2016-05-14 ENCOUNTER — Telehealth: Payer: Self-pay | Admitting: Family Medicine

## 2016-05-14 MED ORDER — DIAZEPAM 5 MG PO TABS: ORAL_TABLET | ORAL | Status: DC

## 2016-05-14 NOTE — Telephone Encounter (Signed)
Diazepam called into [facility].  Left message for Jill Gonzalez that a prescription has been called into her pharmacy as requested.

## 2016-05-14 NOTE — Telephone Encounter (Signed)
Valium 5 mg, 1 po 30 mins before MRI. #1, 0 ref

## 2016-05-14 NOTE — Telephone Encounter (Signed)
Patient called to tell me that [facility] called her to schedule her MRI in their Open Unit on 05/21/16. She is requesting a sedative be called in to her pharmacy because she is extremely claustrophobic. She uses [facility]. Please call the patient when you have called in the medicine.

## 2016-05-21 ENCOUNTER — Ambulatory Visit
Admission: RE | Admit: 2016-05-21 | Discharge: 2016-05-21 | Disposition: A | Discharge status: Home / Self Care | Payer: 59 | Source: Ambulatory Visit | Attending: Family Medicine | Admitting: Family Medicine

## 2016-05-21 DIAGNOSIS — M25511 Pain in right shoulder: Secondary | ICD-10-CM

## 2016-05-21 DIAGNOSIS — R29898 Other symptoms and signs involving the musculoskeletal system: Secondary | ICD-10-CM

## 2016-05-22 ENCOUNTER — Other Ambulatory Visit: Payer: Self-pay

## 2016-05-22 MED ORDER — LEVOCETIRIZINE DIHYDROCHLORIDE 5 MG PO TABS: 5.0000 mg | ORAL_TABLET | Freq: Every evening | ORAL | Status: DC

## 2016-06-01 ENCOUNTER — Encounter: Payer: Self-pay | Admitting: Family Medicine

## 2016-06-01 ENCOUNTER — Ambulatory Visit (INDEPENDENT_AMBULATORY_CARE_PROVIDER_SITE_OTHER): Payer: 59 | Admitting: Family Medicine

## 2016-06-01 VITALS — BP 126/80 | HR 82 | Temp 98.8°F | Ht 62.0 in | Wt 203.2 lb

## 2016-06-01 DIAGNOSIS — M7521 Bicipital tendinitis, right shoulder: Secondary | ICD-10-CM | POA: Diagnosis not present

## 2016-06-01 DIAGNOSIS — M654 Radial styloid tenosynovitis [de Quervain]: Secondary | ICD-10-CM | POA: Diagnosis not present

## 2016-06-01 DIAGNOSIS — M7581 Other shoulder lesions, right shoulder: Secondary | ICD-10-CM | POA: Diagnosis not present

## 2016-06-01 DIAGNOSIS — M6530 Trigger finger, unspecified finger: Secondary | ICD-10-CM

## 2016-06-01 DIAGNOSIS — M7551 Bursitis of right shoulder: Secondary | ICD-10-CM

## 2016-06-01 MED ORDER — NAPROXEN SODIUM 550 MG PO TABS: 550.0000 mg | ORAL_TABLET | Freq: Two times a day (BID) | ORAL | 1 refills | Status: DC

## 2016-06-01 MED ORDER — METHYLPREDNISOLONE ACETATE 40 MG/ML IJ SUSP
20.0000 mg | Freq: Once | INTRAMUSCULAR | Status: AC
  Administered 2016-06-01: 20 mg via INTRA_ARTICULAR

## 2016-06-01 NOTE — Progress Notes (Signed)
Pre visit review using our clinic review tool, if applicable. No additional management support is needed unless otherwise documented below in the visit note. 

## 2016-06-01 NOTE — Progress Notes (Signed)
Dr. Frederico Hamman T. Linlee Cromie, MD, [facility] [place], [address] Phone: [phone] Fax: [phone]  06/01/2016  Patient: Jill Gonzalez, MRN: [id number], DOB: 1956/10/21, 60 y.o.  Primary Physician:  Webb Silversmith, NP   Chief Complaint  Patient presents with  . Follow-up    Right Shoulder/MRI results   Subjective:   Jill Gonzalez is a 60 y.o. very pleasant female patient who presents with the following:  Patient is here in follow-up. We went over her MRI face-to-face. On her last office visit, I was very concerned and the patient had ruptured a rotator cuff tear.  MRI shows supraspinatus as well as infraspinatus tendinopathy along with some subacromial bursitis. She also has some biceps tendinopathy. No evidence for rotator cuff tear, and she does have some before meals arthropathy as well.  Her shoulder is done quite a bit better, her range of motion is improved, and her pain is improved. She is still getting some impingement.  [name] on the left is much better, but still somewhat symptomatic.  Her trigger finger on the right second digit has recurred.  05/14/2016 Last OV with [name], MD  Was moving a chair, recliner in the room at the hospital. [facility] chair from one side to another. Pulled  Up in the bed. Patient with multiple chairs, she also lifted her mother while she was in the hospital. After then, she has hardly been able to use her right shoulder, and she is unable to abduct or flex her shoulder. She has minimal muscle function, and this occurred after this known injury. This occurred approximately for 5 days ago.  She presented yesterday to an urgent care center, and they referred her back to me for follow-up. She currently is taking some Naprosyn, additionally she is taking some baclofen.  She continues to have worsening pain of the de Quervain's that I saw her for some time ago. She has been  doing some rehabilitation and traditional conservative management for this without much success.  Past Medical History, Surgical History, Social History, Family History, Problem List, Medications, and Allergies have been reviewed and updated if relevant.  Patient Active Problem List   Diagnosis Date Noted  . Breast cancer of lower-outer quadrant of left female breast ([place]) 05/21/2014  . Allergic rhinitis 05/08/2014  . GERD (gastroesophageal reflux disease) 05/08/2014  . Hypothyroid 05/08/2014  . Arthritis 05/08/2014  . Family history of breast cancer 05/08/2014    Past Medical History:  Diagnosis Date  . Allergy   . Anemia   . Arthritis   . Blood transfusion without reported diagnosis 1977  . Bronchitis   . GERD (gastroesophageal reflux disease)   . Hot flashes   . Phlebitis   . PONV (postoperative nausea and vomiting)   . Thyroid disease     Past Surgical History:  Procedure Laterality Date  . BREAST BIOPSY Bilateral 2011, 2012  . BREAST LUMPECTOMY     left  . CERVICAL CONE BIOPSY  1992  . CESAREAN SECTION  1981  . MOUTH SURGERY    . TONSILLECTOMY    . TUBAL LIGATION Right 1972    Social History   Social History  . Marital status: Married    Spouse name: N/A  . Number of children: 2  . 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 Smoking status: Never  Smoker  . Smokeless tobacco: Never Used  . Alcohol use No  . Drug use: No  . Sexual activity: Yes   Other Topics Concern  . Not on file   Social History Narrative  . No narrative on file    Family History  Problem Relation Age of Onset  . Arthritis Mother   . Cancer Mother 59    Breast alive age 28  . Hyperlipidemia Mother   . Heart disease Mother   . Hypertension Mother   . Stroke Brother   . Diabetes Brother   . Hypertension Sister   . Stomach cancer Father   . Colon cancer Neg Hx   . Pancreatic cancer Neg  Hx   . Cancer Paternal Uncle     Allergies  Allergen Reactions  . Sulfa Antibiotics Rash    fever    Medication list reviewed and updated in full in [place].  GEN: No fevers, chills. Nontoxic. Primarily MSK c/o today. MSK: Detailed in the HPI GI: tolerating PO intake without difficulty Neuro: No numbness, parasthesias, or tingling associated. Otherwise the pertinent positives of the ROS are noted above.   Objective:   BP 126/80 (BP Location: Right Arm, Patient Position: Sitting, Cuff Size: Large)   Pulse 82   Temp 98.8 F (37.1 C) (Oral)   Ht 5\' 2"  (1.575 m)   Wt 203 lb 4 oz (92.2 kg)   BMI 37.17 kg/m    GEN: WDWN, NAD, Non-toxic, Alert & Oriented x 3 HEENT: Atraumatic, Normocephalic.  Ears and Nose: No external deformity. EXTR: No clubbing/cyanosis/edema NEURO: Normal gait.  PSYCH: Normally interactive. Conversant. Not depressed or anxious appearing.  Calm demeanor.   Shoulder: R Inspection: No muscle wasting or winging Ecchymosis/edema: neg  AC joint, scapula, clavicle: TTP at [facility] joint Cervical spine: NT, full ROM Spurling's: neg Hawkins pos Neer pos Abduction: 4+/5 Flexion: , 4+/5 IR, full, lift-off: 5/5 ER at neutral: full, 4+/5 Drop test is neg Other special testing is equivocal. C5-T1 intact  Neuro: Sensation intact Grip 4/5   Hand: L Ecchymosis or edema: neg ROM wrist/hand/digits/elbow: full  Carpals, MCP's, digits: NT Distal Ulna and Radius: NT Supination lift test: neg Ecchymosis or edema: neg Cysts/nodules: neg Finkelstein's test: POS Snuffbox tenderness: neg Scaphoid tubercle: NT Hook of Hamate: NT Resisted supination: NT Full composite fist Grip, all digits: 4/5 str No tenosynovitis Axial load test: neg Phalen's: neg Tinel's: neg Atrophy: neg  Hand sensation: intact   Radiology: Mr Shoulder Right Wo Contrast  Result Date: 05/21/2016 CLINICAL DATA:  Right shoulder pain.  Limited range of motion. EXAM: MRI OF THE  RIGHT SHOULDER WITHOUT CONTRAST TECHNIQUE: Multiplanar, multisequence MR imaging of the shoulder was performed. No intravenous contrast was administered. COMPARISON:  None. FINDINGS: Rotator cuff: Moderate tendinosis of the supraspinatus and infraspinatus tendons with fraying along the bursal surface of the supraspinatus tendon. Teres minor tendon is intact. Subscapularis tendon is intact. Muscles: No atrophy or fatty replacement of nor abnormal signal within, the muscles of the rotator cuff. Biceps long head: Mild tendinosis of the intraarticular portion of the long head of the biceps tendon. Acromioclavicular Joint: Moderate degenerative changes of the acromioclavicular joint. Type II acromion. Moderate amount of subacromial/subdeltoid bursal fluid. Glenohumeral Joint: No joint effusion.  No chondral defect. Labrum: Grossly intact, but evaluation is limited by lack of intraarticular fluid. Bones: No other marrow signal abnormality. No fracture or dislocation. Other: None IMPRESSION: 1. Moderate tendinosis of the supraspinatus and infraspinatus tendons with fraying along  the bursal surface of the supraspinatus tendon. 2. Mild tendinosis of the intraarticular portion of the long head of the biceps tendon. 3. Moderate subacromial/subdeltoid bursitis. Electronically Signed   By: Kathreen Devoid   On: 05/21/2016 18:19   Assessment and Plan:   Rotator cuff tendonitis, right - Plan: Ambulatory referral to Physical Therapy  De Quervain's tenosynovitis, left - Plan: Ambulatory referral to Physical Therapy  Subacromial bursitis, right - Plan: Ambulatory referral to Physical Therapy  Biceps tendonitis, right - Plan: Ambulatory referral to Physical Therapy  Trigger finger, acquired - Plan: methylPREDNISolone acetate (DEPO-MEDROL) injection 20 mg  Shoulders are improved dramatically compared to last time I saw the patient a few weeks ago. We're to have her do some rotator cuff scapular stabilization with formal  physical therapy as well as rehabilitation for her de Quervain's tenosynovitis.  Trigger finger, failure to improve status post one injection of corticosteroid. Trial again #2.  Trigger Finger Injection, R 2nd Verbal consent was obtained. Risks (including rare risk of infection, potential risk for skin lightening and potential atrophy), benefits and alternatives were discussed. Prepped with Chloraprep and Ethyl Chloride used for anesthesia. Under sterile conditions, patient injected at palmar crease aiming distally with 45 degree angle towards nodule; injected directly into tendon sheath. Medication flowed freely without resistance.  Needle size: 22 gauge 1 1/2 inch Injection: 1/2 cc of Lidocaine 1% and Depo-Medrol 20 mg   Follow-up: Return in about 6 weeks (around 07/13/2016).  Modified Medications   Modified Medication Previous Medication   NAPROXEN SODIUM (ANAPROX) 550 MG TABLET naproxen sodium (ANAPROX) 550 MG tablet      Take 1 tablet (550 mg total) by mouth 2 (two) times daily with a meal.       Orders Placed This Encounter  Procedures  . Ambulatory referral to Physical Therapy    Signed,  Frederico Hamman T. Jahnia Hewes, MD   Patient's Medications  New Prescriptions   No medications on file  Previous Medications   ANASTROZOLE (ARIMIDEX) 1 MG TABLET    Take 1 tablet (1 mg total) by mouth daily. Patient prefers Brand name drug   CHOLECALCIFEROL (VITAMIN D3) 5000 UNITS CAPS    Take 2 capsules by mouth daily.   DIAZEPAM (VALIUM) 5 MG TABLET    Take one tablets by mouth 30 minutes prior to MRI   FLAXSEED, LINSEED, (FLAX SEED OIL PO)    Take 2 capsules by mouth daily.   FLUTICASONE (FLONASE) 50 MCG/ACT NASAL SPRAY    Place into the nose.   LEVOCETIRIZINE (XYZAL) 5 MG TABLET    Take 1 tablet (5 mg total) by mouth every evening. PLEASE SCHEDULE ANNUAL EXAM   NON FORMULARY    Take 2 capsules by mouth daily. 24/7 Solution--Sugar control   OMEPRAZOLE (PRILOSEC) 20 MG CAPSULE    Take 1 capsule (20  mg total) by mouth daily.   POTASSIUM CITRATE PO    Take by mouth.   VOLTAREN 1 % GEL    Apply 2 g topically 4 (four) times daily as needed.  Modified Medications   Modified Medication Previous Medication   NAPROXEN SODIUM (ANAPROX) 550 MG TABLET naproxen sodium (ANAPROX) 550 MG tablet      Take 1 tablet (550 mg total) by mouth 2 (two) times daily with a meal.      Discontinued Medications   BACLOFEN (LIORESAL) 10 MG TABLET       DICLOFENAC (VOLTAREN) 75 MG EC TABLET    Take 1 tablet (75 mg total) by  mouth 2 (two) times daily.   IBUPROFEN (ADVIL,MOTRIN) 600 MG TABLET    Take 1 tablet (600 mg total) by mouth every 8 (eight) hours as needed.   LIDOCAINE (LIDODERM) 5 %    Place 1 patch onto the skin daily. Remove & Discard patch within 12 hours or as directed by MD

## 2016-06-01 NOTE — Patient Instructions (Signed)

## 2016-07-15 ENCOUNTER — Encounter: Payer: Self-pay | Admitting: Family Medicine

## 2016-07-15 ENCOUNTER — Ambulatory Visit (INDEPENDENT_AMBULATORY_CARE_PROVIDER_SITE_OTHER): Payer: 59 | Admitting: Family Medicine

## 2016-07-15 VITALS — BP 128/74 | HR 74 | Temp 97.7°F | Ht 62.0 in | Wt 206.2 lb

## 2016-07-15 DIAGNOSIS — M7581 Other shoulder lesions, right shoulder: Secondary | ICD-10-CM | POA: Diagnosis not present

## 2016-07-15 DIAGNOSIS — Z23 Encounter for immunization: Secondary | ICD-10-CM | POA: Diagnosis not present

## 2016-07-15 DIAGNOSIS — M6530 Trigger finger, unspecified finger: Secondary | ICD-10-CM | POA: Diagnosis not present

## 2016-07-15 DIAGNOSIS — M654 Radial styloid tenosynovitis [de Quervain]: Secondary | ICD-10-CM | POA: Diagnosis not present

## 2016-07-15 NOTE — Progress Notes (Signed)
Dr. Frederico Hamman T. Chester Romero, MD, [facility] [place], [address] Phone: [phone] Fax: [phone]  07/15/2016  Patient: Jill Gonzalez, MRN: [id number], DOB: 20-Sep-1956, 60 y.o.  Primary Physician:  Jill Silversmith, NP   Chief Complaint  Patient presents with  . Follow-up    Right Shoulder   Subjective:   Jill Gonzalez is a 60 y.o. very pleasant female patient who presents with the following:  Shoulder, hands, trigger finger and Jill Gonzalez are all doing well and asx  06/01/2016 Last OV with Jill Loffler, MD  Patient is here in follow-up. We went over her MRI face-to-face. On her last office visit, I was very concerned and the patient had ruptured a rotator cuff tear.  MRI shows supraspinatus as well as infraspinatus tendinopathy along with some subacromial bursitis. She also has some biceps tendinopathy. No evidence for rotator cuff tear, and she does have some before meals arthropathy as well.  Her shoulder is done quite a bit better, her range of motion is improved, and her pain is improved. She is still getting some impingement.  Jill Gonzalez on the left is much better, but still somewhat symptomatic.  Her trigger finger on the right second digit has recurred.  05/14/2016 Last OV with Jill Loffler, MD  Was moving a chair, recliner in the room at the hospital. [facility] chair from one side to another. Pulled  Up in the bed. Patient with multiple chairs, she also lifted her mother while she was in the hospital. After then, she has hardly been able to use her right shoulder, and she is unable to abduct or flex her shoulder. She has minimal muscle function, and this occurred after this known injury. This occurred approximately for 5 days ago.  She presented yesterday to an urgent care center, and they referred her back to me for follow-up. She currently is taking some Naprosyn, additionally she is taking some  baclofen.  She continues to have worsening pain of the de Quervain's that I saw her for some time ago. She has been doing some rehabilitation and traditional conservative management for this without much success.  Past Medical History, Surgical History, Social History, Family History, Problem List, Medications, and Allergies have been reviewed and updated if relevant.  Patient Active Problem List   Diagnosis Date Noted  . Breast cancer of lower-outer quadrant of left female breast ([place]) 05/21/2014  . Allergic rhinitis 05/08/2014  . GERD (gastroesophageal reflux disease) 05/08/2014  . Hypothyroid 05/08/2014  . Arthritis 05/08/2014  . Family history of breast cancer 05/08/2014    Past Medical History:  Diagnosis Date  . Allergy   . Anemia   . Arthritis   . Blood transfusion without reported diagnosis 1977  . Bronchitis   . GERD (gastroesophageal reflux disease)   . Hot flashes   . Phlebitis   . PONV (postoperative nausea and vomiting)   . Thyroid disease     Past Surgical History:  Procedure Laterality Date  . BREAST BIOPSY Bilateral 2011, 2012  . BREAST LUMPECTOMY     left  . CERVICAL CONE BIOPSY  1992  . CESAREAN SECTION  1981  . MOUTH SURGERY    . TONSILLECTOMY    . TUBAL LIGATION Right 1972    Social History   Social History  . Marital status: Married    Spouse name: N/A  . Number of children: 2  . Years of education: N/A   Occupational History  .  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 Smoking status: Never Smoker  . Smokeless tobacco: Never Used  . Alcohol use No  . Drug use: No  . Sexual activity: Yes   Other Topics Concern  . Not on file   Social History Narrative  . No narrative on file    Family History  Problem Relation Age of Onset  . Arthritis Mother   . Cancer Mother 15    Breast alive age 85  . Hyperlipidemia Mother   . Heart disease Mother   . Hypertension Mother   . Stroke Brother   .  Diabetes Brother   . Hypertension Sister   . Stomach cancer Father   . Colon cancer Neg Hx   . Pancreatic cancer Neg Hx   . Cancer Paternal Uncle     Allergies  Allergen Reactions  . Sulfa Antibiotics Rash    fever    Medication list reviewed and updated in full in [facility].  GEN: No fevers, chills. Nontoxic. Primarily MSK c/o today. MSK: Detailed in the HPI GI: tolerating PO intake without difficulty Neuro: No numbness, parasthesias, or tingling associated. Otherwise the pertinent positives of the ROS are noted above.   Objective:   BP 128/74   Pulse 74   Temp 97.7 F (36.5 C) (Oral)   Ht 5\' 2"  (1.575 m)   Wt 206 lb 4 oz (93.6 kg)   BMI 37.72 kg/m    GEN: WDWN, NAD, Non-toxic, Alert & Oriented x 3 HEENT: Atraumatic, Normocephalic.  Ears and Nose: No external deformity. EXTR: No clubbing/cyanosis/edema NEURO: Normal gait.  PSYCH: Normally interactive. Conversant. Not depressed or anxious appearing.  Calm demeanor.   Shoulder: R Inspection: No muscle wasting or winging Ecchymosis/edema: neg  AC joint, scapula, clavicle: NT Cervical spine: NT, full ROM Spurling's: neg Abduction: full, 5/5 Flexion: full, 5/5 IR, full, lift-off: 5/5 ER at neutral: full, 5/5 AC crossover and compression: neg Neer: neg Hawkins: neg Drop Test: neg Empty Can: neg Supraspinatus insertion: NT Bicipital groove: NT Speed's: neg Yergason's: neg Sulcus sign: neg Scapular dyskinesis: none C5-T1 intact Sensation intact Grip 5/5   Hand: L Ecchymosis or edema: neg ROM wrist/hand/digits/elbow: full  Carpals, MCP's, digits: NT Distal Ulna and Radius: NT Supination lift test: neg Ecchymosis or edema: neg Cysts/nodules: neg Finkelstein's test: neg Snuffbox tenderness: neg Scaphoid tubercle: NT Hook of Hamate: NT Resisted supination: NT Full composite fist Grip, all digits: 5/5 str No tenosynovitis Axial load test: neg Phalen's: neg Tinel's: neg Atrophy:  neg  Hand sensation: intact   Radiology: Mr Shoulder Right Wo Contrast  Result Date: 05/21/2016 CLINICAL DATA:  Right shoulder pain.  Limited range of motion. EXAM: MRI OF THE RIGHT SHOULDER WITHOUT CONTRAST TECHNIQUE: Multiplanar, multisequence MR imaging of the shoulder was performed. No intravenous contrast was administered. COMPARISON:  None. FINDINGS: Rotator cuff: Moderate tendinosis of the supraspinatus and infraspinatus tendons with fraying along the bursal surface of the supraspinatus tendon. Teres minor tendon is intact. Subscapularis tendon is intact. Muscles: No atrophy or fatty replacement of nor abnormal signal within, the muscles of the rotator cuff. Biceps long head: Mild tendinosis of the intraarticular portion of the long head of the biceps tendon. Acromioclavicular Joint: Moderate degenerative changes of the acromioclavicular joint. Type II acromion. Moderate amount of subacromial/subdeltoid bursal fluid. Glenohumeral Joint: No joint effusion.  No chondral defect. Labrum: Grossly intact, but evaluation is limited by lack of intraarticular fluid. Bones: No  other marrow signal abnormality. No fracture or dislocation. Other: None IMPRESSION: 1. Moderate tendinosis of the supraspinatus and infraspinatus tendons with fraying along the bursal surface of the supraspinatus tendon. 2. Mild tendinosis of the intraarticular portion of the long head of the biceps tendon. 3. Moderate subacromial/subdeltoid bursitis. Electronically Signed   By: Kathreen Devoid   On: 05/21/2016 18:19   Assessment and Plan:   Rotator cuff tendonitis, right  De Quervain's tenosynovitis, left  Trigger finger, acquired  All improved.   F/u prn  Follow-up: No Follow-up on file.  New Prescriptions   No medications on file   Modified Medications   No medications on file   No orders of the defined types were placed in this encounter.   Signed,  Maud Deed. Narjis Mira, MD   Patient's Medications  New  Prescriptions   No medications on file  Previous Medications   ANASTROZOLE (ARIMIDEX) 1 MG TABLET    Take 1 tablet (1 mg total) by mouth daily. Patient prefers Brand name drug   CHOLECALCIFEROL (VITAMIN D3) 5000 UNITS CAPS    Take 2 capsules by mouth daily.   FLAXSEED, LINSEED, (FLAX SEED OIL PO)    Take 2 capsules by mouth daily.   FLUTICASONE (FLONASE) 50 MCG/ACT NASAL SPRAY    Place into the nose.   LEVOCETIRIZINE (XYZAL) 5 MG TABLET    Take 1 tablet (5 mg total) by mouth every evening. PLEASE SCHEDULE ANNUAL EXAM   NON FORMULARY    Take 2 capsules by mouth daily. 24/7 Solution--Sugar control   OMEPRAZOLE (PRILOSEC) 20 MG CAPSULE    Take 1 capsule (20 mg total) by mouth daily.   POTASSIUM CITRATE PO    Take by mouth.  Modified Medications   No medications on file  Discontinued Medications   DIAZEPAM (VALIUM) 5 MG TABLET    Take one tablets by mouth 30 minutes prior to MRI   NAPROXEN SODIUM (ANAPROX) 550 MG TABLET    Take 1 tablet (550 mg total) by mouth 2 (two) times daily with a meal.   VOLTAREN 1 % GEL    Apply 2 g topically 4 (four) times daily as needed.

## 2016-07-15 NOTE — Addendum Note (Signed)
Addended by: Magdalen Spatz C on: 07/15/2016 08:54 AM   Modules accepted: Orders

## 2016-07-15 NOTE — Progress Notes (Signed)
Pre visit review using our clinic review tool, if applicable. No additional management support is needed unless otherwise documented below in the visit note. 

## 2016-07-25 ENCOUNTER — Other Ambulatory Visit: Payer: Self-pay | Admitting: Internal Medicine

## 2016-07-25 DIAGNOSIS — K219 Gastro-esophageal reflux disease without esophagitis: Secondary | ICD-10-CM

## 2016-08-22 IMAGING — DX DG SHOULDER 2+V*R*
3 series · 3 of 3 positions shown · non-contrast
Comparison: None.

CLINICAL DATA: Right shoulder pain for 2 days. Limited range of
motion. No known injury.

EXAM:
RIGHT SHOULDER - 2+ VIEW

[shoulder y-view]
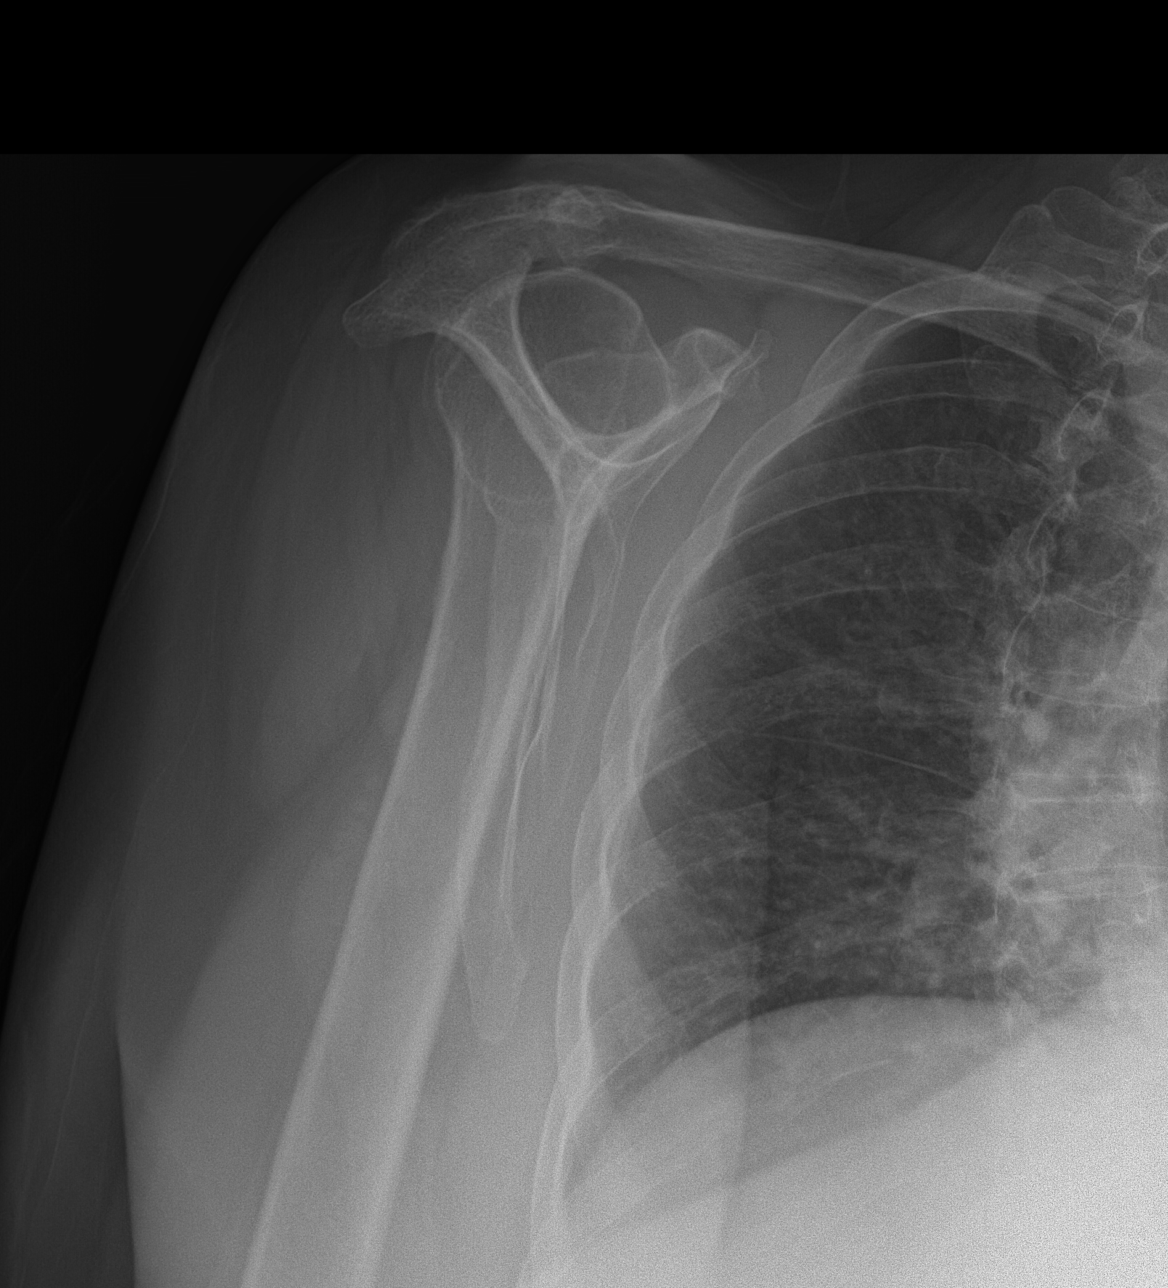

[shoulder axial]
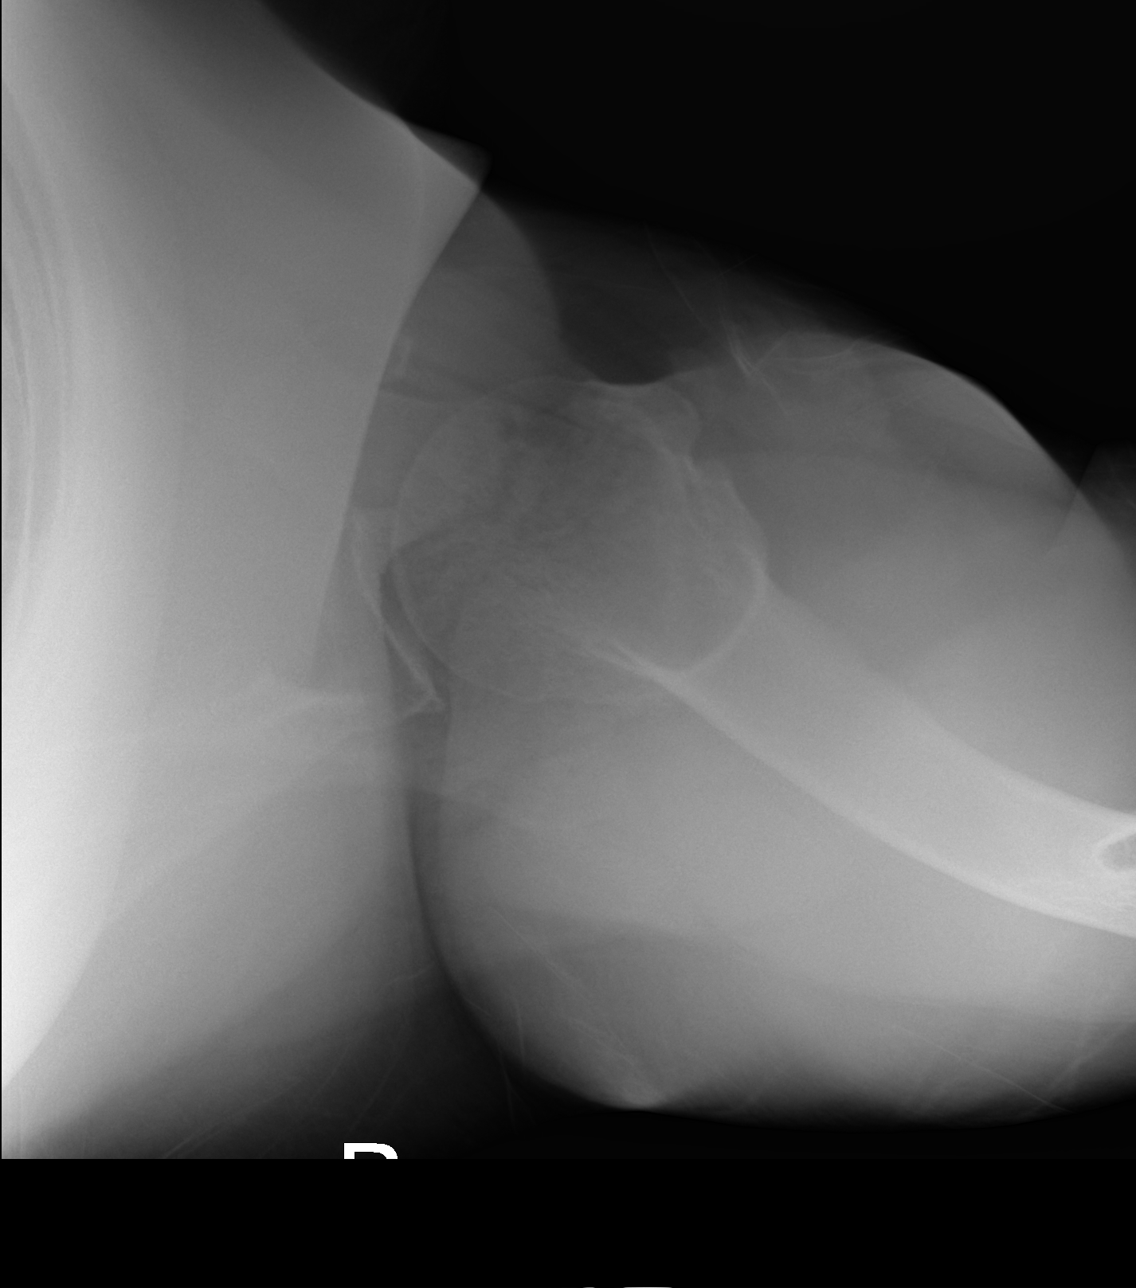

[shoulder ap]
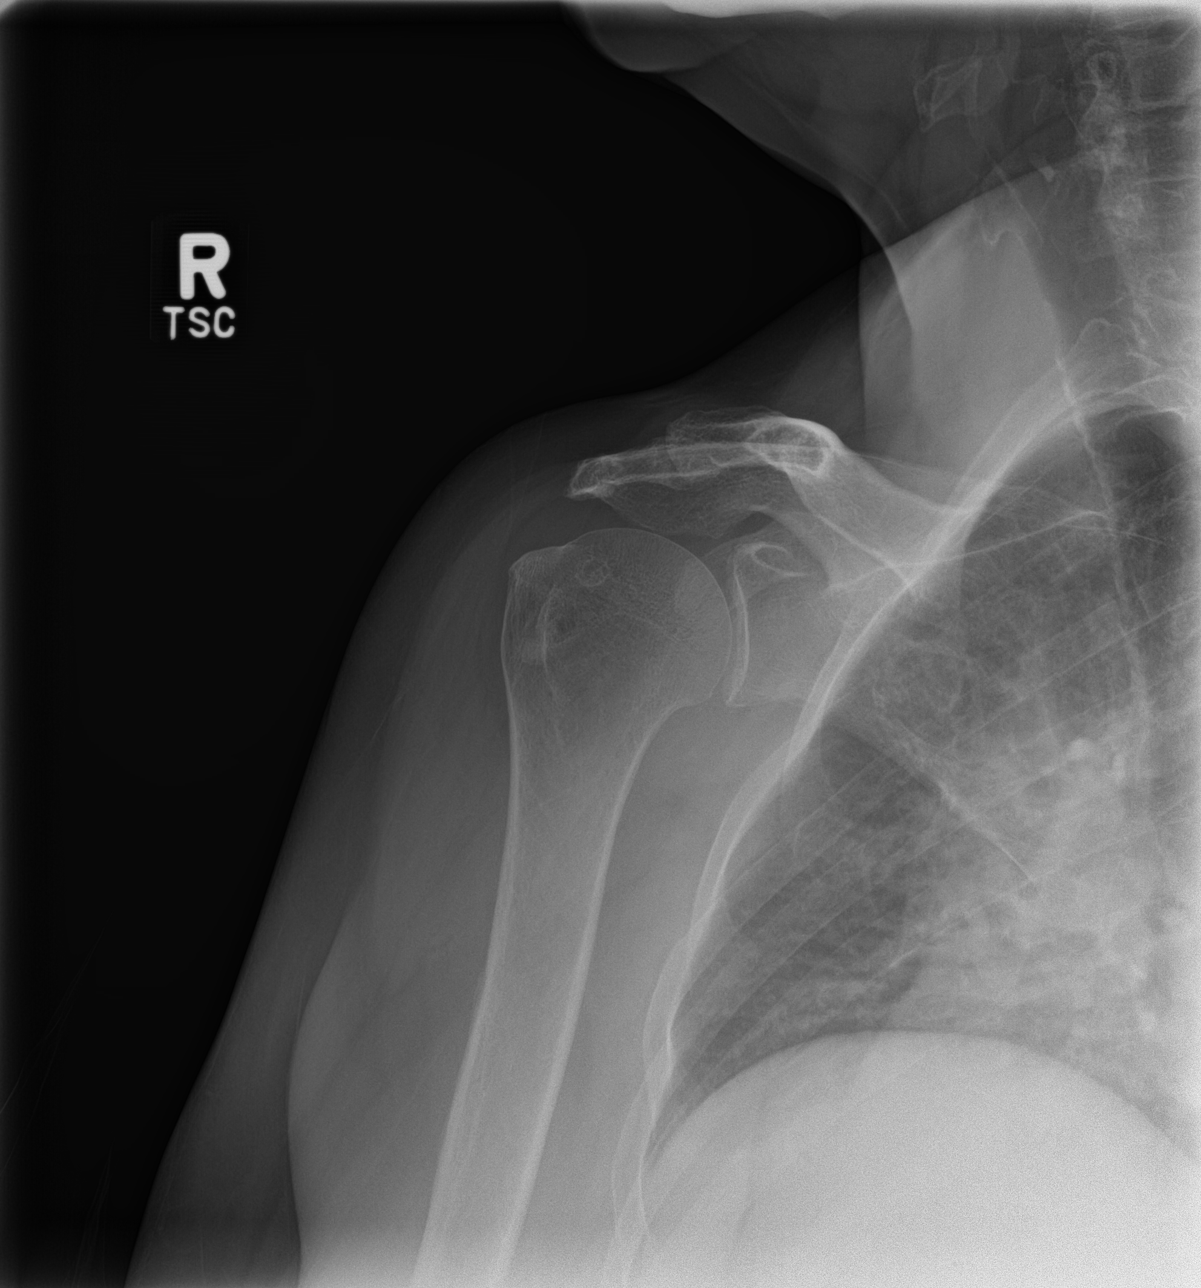

[3 of 3 positions shown; findings below may reference images not displayed]

FINDINGS: There is no evidence of fracture or dislocation. Generalized
osteopenia noted. Mild degenerative hypertrophy seen involving the
acromioclavicular joint.
IMPRESSION: No acute findings.

Mild acromioclavicular DJD and osteopenia.

## 2016-10-20 ENCOUNTER — Other Ambulatory Visit: Payer: Self-pay | Admitting: Hematology and Oncology

## 2016-10-20 DIAGNOSIS — C50512 Malignant neoplasm of lower-outer quadrant of left female breast: Secondary | ICD-10-CM

## 2016-11-05 ENCOUNTER — Other Ambulatory Visit: Payer: Self-pay | Admitting: Internal Medicine

## 2016-11-05 DIAGNOSIS — K219 Gastro-esophageal reflux disease without esophagitis: Secondary | ICD-10-CM

## 2016-11-23 ENCOUNTER — Other Ambulatory Visit: Payer: Self-pay | Admitting: Hematology and Oncology

## 2016-11-23 DIAGNOSIS — C50512 Malignant neoplasm of lower-outer quadrant of left female breast: Secondary | ICD-10-CM

## 2016-12-02 ENCOUNTER — Other Ambulatory Visit: Payer: Self-pay | Admitting: Internal Medicine

## 2016-12-02 DIAGNOSIS — K219 Gastro-esophageal reflux disease without esophagitis: Secondary | ICD-10-CM

## 2016-12-09 ENCOUNTER — Telehealth: Payer: Self-pay | Admitting: Internal Medicine

## 2016-12-09 ENCOUNTER — Other Ambulatory Visit: Payer: Self-pay | Admitting: Internal Medicine

## 2016-12-09 DIAGNOSIS — K219 Gastro-esophageal reflux disease without esophagitis: Secondary | ICD-10-CM

## 2016-12-09 MED ORDER — OMEPRAZOLE 20 MG PO CPDR: 20.0000 mg | DELAYED_RELEASE_CAPSULE | Freq: Every day | ORAL | 0 refills | Status: DC

## 2016-12-09 NOTE — Telephone Encounter (Signed)
Patient called to schedule her cpx on 01/05/17.  Can her rx for Omneprazole be called in to [facility]?  She will run out of medication in 3 days.  Please call patient to let her know.

## 2016-12-09 NOTE — Telephone Encounter (Signed)
Yes I will send it in

## 2017-01-05 ENCOUNTER — Encounter: Payer: 59 | Admitting: Internal Medicine

## 2017-01-13 ENCOUNTER — Encounter: Payer: Self-pay | Admitting: Internal Medicine

## 2017-01-13 ENCOUNTER — Ambulatory Visit (INDEPENDENT_AMBULATORY_CARE_PROVIDER_SITE_OTHER): Payer: 59 | Admitting: Internal Medicine

## 2017-01-13 VITALS — BP 126/74 | HR 70 | Temp 98.1°F | Ht 62.0 in | Wt 203.5 lb

## 2017-01-13 DIAGNOSIS — K219 Gastro-esophageal reflux disease without esophagitis: Secondary | ICD-10-CM | POA: Diagnosis not present

## 2017-01-13 DIAGNOSIS — C50512 Malignant neoplasm of lower-outer quadrant of left female breast: Secondary | ICD-10-CM | POA: Diagnosis not present

## 2017-01-13 DIAGNOSIS — M1990 Unspecified osteoarthritis, unspecified site: Secondary | ICD-10-CM | POA: Diagnosis not present

## 2017-01-13 DIAGNOSIS — Z1159 Encounter for screening for other viral diseases: Secondary | ICD-10-CM

## 2017-01-13 DIAGNOSIS — Z0000 Encounter for general adult medical examination without abnormal findings: Secondary | ICD-10-CM

## 2017-01-13 DIAGNOSIS — J301 Allergic rhinitis due to pollen: Secondary | ICD-10-CM | POA: Diagnosis not present

## 2017-01-13 DIAGNOSIS — E039 Hypothyroidism, unspecified: Secondary | ICD-10-CM | POA: Diagnosis not present

## 2017-01-13 DIAGNOSIS — Z170 Estrogen receptor positive status [ER+]: Secondary | ICD-10-CM

## 2017-01-13 LAB — LIPID PANEL
Cholesterol: 215 mg/dL — ABNORMAL HIGH (ref 0–200)
HDL: 59.9 mg/dL (ref >39.00)
LDL CALC: 137 mg/dL — AB (ref 0–99)
NonHDL: 155.39
TRIGLYCERIDES: 91 mg/dL (ref 0.0–149.0)
Total CHOL/HDL Ratio: 4
VLDL: 18.2 mg/dL (ref 0.0–40.0)

## 2017-01-13 LAB — COMPREHENSIVE METABOLIC PANEL
ALK PHOS: 66 U/L (ref 39–117)
ALT: 13 U/L (ref 0–35)
AST: 18 U/L (ref 0–37)
Albumin: 4.2 g/dL (ref 3.5–5.2)
BUN: 7 mg/dL (ref 6–23)
CO2: 32 mEq/L (ref 19–32)
Calcium: 9.6 mg/dL (ref 8.4–10.5)
Chloride: 102 mEq/L (ref 96–112)
Creatinine, Ser: 0.57 mg/dL (ref 0.40–1.20)
GFR: 138.32 mL/min (ref >60.00)
GLUCOSE: 88 mg/dL (ref 70–99)
POTASSIUM: 3.8 meq/L (ref 3.5–5.1)
Sodium: 141 mEq/L (ref 135–145)
TOTAL PROTEIN: 7.2 g/dL (ref 6.0–8.3)
Total Bilirubin: 0.5 mg/dL (ref 0.2–1.2)

## 2017-01-13 LAB — CBC
HEMATOCRIT: 35.6 % — AB (ref 36.0–46.0)
HEMOGLOBIN: 11.9 g/dL — AB (ref 12.0–15.0)
MCHC: 33.5 g/dL (ref 30.0–36.0)
MCV: 94.8 fl (ref 78.0–100.0)
Platelets: 279 10*3/uL (ref 150.0–400.0)
RBC: 3.75 Mil/uL — ABNORMAL LOW (ref 3.87–5.11)
RDW: 13.5 % (ref 11.5–15.5)
WBC: 4.9 10*3/uL (ref 4.0–10.5)

## 2017-01-13 LAB — T4, FREE: Free T4: 1.05 ng/dL (ref 0.60–1.60)

## 2017-01-13 LAB — TSH: TSH: 0.83 u[IU]/mL (ref 0.35–4.50)

## 2017-01-13 LAB — HEMOGLOBIN A1C: Hgb A1c MFr Bld: 5.2 % (ref 4.6–6.5)

## 2017-01-13 MED ORDER — OMEPRAZOLE 40 MG PO CPDR: 40.0000 mg | DELAYED_RELEASE_CAPSULE | Freq: Every day | ORAL | 3 refills | Status: DC

## 2017-01-13 NOTE — Assessment & Plan Note (Signed)
TSH and T4 today

## 2017-01-13 NOTE — Progress Notes (Signed)
Subjective:    Patient ID: Jill Gonzalez, female    DOB: 04-10-1956, 61 y.o.   MRN: [id number]  HPI  Pt presents to the clinic today for her annual exam. She is also due to follow up of chronic conditions.  Seasonal Allergies: Usually worse in the spring. She takes Xyzal and Flonase as needed with good relief.  Arthritis: Mainly in her shoulders. She has been having issues with tendonitis and has been seeing Dr. Lorelei Pont. She takes Diclofenac as needed. She has had a MRI and underwent PT with some relief.  GERD: Triggered by acidic and fried foods. She is having some breakthrough symptoms on Prilosec.  Hypothyroidism: No longer on Synthroid. Levels from 03/2015 normal off meds.   History of Breast Cancer: s/p lumpectomy. Now on Arimidex. She follows with Dr. Lindi Adie. Note from 03/2016 reviewed.  Flu: 07/2016 Tetanus: < 10 years Zostovax: never Pap Smear: 04/2014 Mammogram: 04/2016 Colon Screening: 06/2014 Vision Screening: yearly Dentist: biannually  Diet: She does eat meat. She consumes fruits and veggies occasionally. She occasionally eats fried foods. She drinks mostly water. Exercise: None  Review of Systems      Past Medical History:  Diagnosis Date  . Allergy   . Anemia   . Arthritis   . Blood transfusion without reported diagnosis 1977  . Bronchitis   . GERD (gastroesophageal reflux disease)   . Hot flashes   . Phlebitis   . PONV (postoperative nausea and vomiting)   . Thyroid disease     Current Outpatient Prescriptions  Medication Sig Dispense Refill  . anastrozole (ARIMIDEX) 1 MG tablet TAKE 1 TABLET (1 MG TOTAL) BY MOUTH DAILY. PATIENT PREFERS BRAND NAME DRUG 90 tablet 0  . Cholecalciferol (VITAMIN D3) 5000 UNITS CAPS Take 2 capsules by mouth daily.    . Flaxseed, Linseed, (FLAX SEED OIL PO) Take 2 capsules by mouth daily.    . fluticasone (FLONASE) 50 MCG/ACT nasal spray Place into the nose.    . levocetirizine (XYZAL) 5 MG tablet Take 1 tablet (5 mg  total) by mouth every evening. PLEASE SCHEDULE ANNUAL EXAM 90 tablet 0  . NON FORMULARY Take 2 capsules by mouth daily. 24/7 Solution--Sugar control    . omeprazole (PRILOSEC) 20 MG capsule Take 1 capsule (20 mg total) by mouth daily. MUST SCHEDULE ANNUAL PHYSICAL 30 capsule 0  . POTASSIUM CITRATE PO Take by mouth.     No current facility-administered medications for this visit.     Allergies  Allergen Reactions  . Sulfa Antibiotics Rash    fever    Family History  Problem Relation Age of Onset  . Arthritis Mother   . Cancer Mother 73    Breast alive age 58  . Hyperlipidemia Mother   . Heart disease Mother   . Hypertension Mother   . Stroke Brother   . Diabetes Brother   . Hypertension Sister   . Stomach cancer Father   . Colon cancer Neg Hx   . Pancreatic cancer Neg Hx   . Cancer Paternal Uncle     Social History   Social History  . Marital status: Married    Spouse name: N/A  . Number of children: 2  . Years of education: N/A   Occupational History  . 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 Smoking status: Never Smoker  . Smokeless tobacco: Never Used  . Alcohol use No  . Drug use: No  . Sexual  activity: Yes   Other Topics Concern  . Not on file   Social History Narrative  . No narrative on file     Constitutional: Denies fever, malaise, fatigue, headache or abrupt weight changes.  HEENT: Denies eye pain, eye redness, ear pain, ringing in the ears, wax buildup, runny nose, nasal congestion, bloody nose, or sore throat. Respiratory: Denies difficulty breathing, shortness of breath, cough or sputum production.   Cardiovascular: Denies chest pain, chest tightness, palpitations or swelling in the hands or feet.  Gastrointestinal: Pt reports intermittent reflux and constipation. Denies abdominal pain, bloating, diarrhea or blood in the stool.  GU: Pt reports vaginal dryness and painful intercourse. Denies urgency,  frequency, pain with urination, burning sensation, blood in urine, odor or discharge. Musculoskeletal: Pt reports joint pain. Denies decrease in range of motion, difficulty with gait, muscle pain or joint  swelling.  Skin: Denies redness, rashes, lesions or ulcercations.  Neurological: Denies dizziness, difficulty with memory, difficulty with speech or problems with balance and coordination.  Psych: Denies anxiety, depression, SI/HI.  No other specific complaints in a complete review of systems (except as listed in HPI above).  Objective:   Physical Exam  BP 126/74   Pulse 70   Temp 98.1 F (36.7 C) (Oral)   Ht 5\' 2"  (1.575 m)   Wt 203 lb 8 oz (92.3 kg)   SpO2 98%   BMI 37.22 kg/m  Wt Readings from Last 3 Encounters:  01/13/17 203 lb 8 oz (92.3 kg)  07/15/16 206 lb 4 oz (93.6 kg)  06/01/16 203 lb 4 oz (92.2 kg)    General: Appears her stated age, obese in NAD. Skin: Warm, dry and intact.  HEENT: Head: normal shape and size; Eyes: sclera white, no icterus, conjunctiva pink, PERRLA and EOMs intact; Ears: Tm's gray and intact, normal light reflex; Throat/Mouth: Teeth present, mucosa pink and moist, no exudate, lesions or ulcerations noted.  Neck:  Neck supple, trachea midline. No masses, lumps or thyromegaly present.  Cardiovascular: Normal rate and rhythm. S1,S2 noted.  No murmur, rubs or gallops noted. No JVD or BLE edema. No carotid bruits noted. Pulmonary/Chest: Normal effort and positive vesicular breath sounds. No respiratory distress. No wheezes, rales or ronchi noted.  Abdomen: Soft and nontender. Normal bowel sounds. No distention or masses noted. Liver, spleen and kidneys non palpable. Musculoskeletal: Strength 5/5 BUE/BLE. No difficulty with gait.  Neurological: Alert and oriented. Cranial nerves II-XII grossly intact. Coordination normal.  Psychiatric: Mood and affect normal. Behavior is normal. Judgment and thought content normal.     BMET    Component Value  Date/Time   NA 143 06/04/2015 0838   K 3.5 06/04/2015 0838   CL 102 05/02/2015 0920   CO2 30 (H) 06/04/2015 0838   GLUCOSE 95 06/04/2015 0838   BUN 10.4 06/04/2015 0838   CREATININE 0.7 06/04/2015 0838   CALCIUM 9.2 06/04/2015 0838    Lipid Panel     Component Value Date/Time   CHOL 204 (H) 05/02/2015 0920   TRIG 61.0 05/02/2015 0920   HDL 58.90 05/02/2015 0920   CHOLHDL 3 05/02/2015 0920   VLDL 12.2 05/02/2015 0920   LDLCALC 133 (H) 05/02/2015 0920    CBC    Component Value Date/Time   WBC 4.0 06/04/2015 0838   WBC 4.4 05/02/2015 0920   RBC 3.80 06/04/2015 0838   RBC 3.82 (L) 05/02/2015 0920   HGB 12.0 06/04/2015 0838   HCT 35.9 06/04/2015 0838   PLT 269 06/04/2015 8841  MCV 94.5 06/04/2015 0838   MCH 31.6 06/04/2015 0838   MCHC 33.4 06/04/2015 0838   MCHC 32.6 05/02/2015 0920   RDW 13.0 06/04/2015 0838   LYMPHSABS 1.4 06/04/2015 0838   MONOABS 0.5 06/04/2015 0838   EOSABS 0.1 06/04/2015 0838   BASOSABS 0.0 06/04/2015 0838    Hgb A1C Lab Results  Component Value Date   HGBA1C 5.2 05/02/2015            Assessment & Plan:   Preventative Health Maintenance:  Flu and Tetanus UTD She declines Zostavax today Pap smear, mammogram and colon screening UTD Encouraged her to consume a balanced diet and exercise regimen Advised her to see an eye doctor and dentist annually Will check CBC, CMET, Lipid, A1C and Hep C today  RTC in 1 year, sooner if needed Webb Silversmith, NP

## 2017-01-13 NOTE — Assessment & Plan Note (Signed)
Deteriorated Avoid triggers Increase Prilosec to 40 mg daily, eRx sent to pharmacy

## 2017-01-13 NOTE — Assessment & Plan Note (Signed)
Continue Diclofenac Advised her to continue to follow up with Dr. Lorelei Pont

## 2017-01-13 NOTE — Patient Instructions (Signed)
Health Maintenance for Postmenopausal Women Menopause is a normal process in which your reproductive ability comes to an end. This process happens gradually over a span of months to years, usually between the ages of 33 and 38. Menopause is complete when you have missed 12 consecutive menstrual periods. It is important to talk with your health care provider about some of the most common conditions that affect postmenopausal women, such as heart disease, cancer, and bone loss (osteoporosis). Adopting a healthy lifestyle and getting preventive care can help to promote your health and wellness. Those actions can also lower your chances of developing some of these common conditions. What should I know about menopause? During menopause, you may experience a number of symptoms, such as:  Moderate-to-severe hot flashes.  Night sweats.  Decrease in sex drive.  Mood swings.  Headaches.  Tiredness.  Irritability.  Memory problems.  Insomnia. Choosing to treat or not to treat menopausal changes is an individual decision that you make with your health care provider. What should I know about hormone replacement therapy and supplements? Hormone therapy products are effective for treating symptoms that are associated with menopause, such as hot flashes and night sweats. Hormone replacement carries certain risks, especially as you become older. If you are thinking about using estrogen or estrogen with progestin treatments, discuss the benefits and risks with your health care provider. What should I know about heart disease and stroke? Heart disease, heart attack, and stroke become more likely as you age. This may be due, in part, to the hormonal changes that your body experiences during menopause. These can affect how your body processes dietary fats, triglycerides, and cholesterol. Heart attack and stroke are both medical emergencies. There are many things that you can do to help prevent heart disease  and stroke:  Have your blood pressure checked at least every 1-2 years. High blood pressure causes heart disease and increases the risk of stroke.  If you are 48-61 years old, ask your health care provider if you should take aspirin to prevent a heart attack or a stroke.  Do not use any tobacco products, including cigarettes, chewing tobacco, or electronic cigarettes. If you need help quitting, ask your health care provider.  It is important to eat a healthy diet and maintain a healthy weight.  Be sure to include plenty of vegetables, fruits, low-fat dairy products, and lean protein.  Avoid eating foods that are high in solid fats, added sugars, or salt (sodium).  Get regular exercise. This is one of the most important things that you can do for your health.  Try to exercise for at least 150 minutes each week. The type of exercise that you do should increase your heart rate and make you sweat. This is known as moderate-intensity exercise.  Try to do strengthening exercises at least twice each week. Do these in addition to the moderate-intensity exercise.  Know your numbers.Ask your health care provider to check your cholesterol and your blood glucose. Continue to have your blood tested as directed by your health care provider. What should I know about cancer screening? There are several types of cancer. Take the following steps to reduce your risk and to catch any cancer development as early as possible. Breast Cancer  Practice breast self-awareness.  This means understanding how your breasts normally appear and feel.  It also means doing regular breast self-exams. Let your health care provider know about any changes, no matter how small.  If you are 40 or older,  have a clinician do a breast exam (clinical breast exam or CBE) every year. Depending on your age, family history, and medical history, it may be recommended that you also have a yearly breast X-ray (mammogram).  If you  have a family history of breast cancer, talk with your health care provider about genetic screening.  If you are at high risk for breast cancer, talk with your health care provider about having an MRI and a mammogram every year.  Breast cancer (BRCA) gene test is recommended for women who have family members with BRCA-related cancers. Results of the assessment will determine the need for genetic counseling and BRCA1 and for BRCA2 testing. BRCA-related cancers include these types:  Breast. This occurs in males or females.  Ovarian.  Tubal. This may also be called fallopian tube cancer.  Cancer of the abdominal or pelvic lining (peritoneal cancer).  Prostate.  Pancreatic. Cervical, Uterine, and Ovarian Cancer  Your health care provider may recommend that you be screened regularly for cancer of the pelvic organs. These include your ovaries, uterus, and vagina. This screening involves a pelvic exam, which includes checking for microscopic changes to the surface of your cervix (Pap test).  For women ages 21-65, health care providers may recommend a pelvic exam and a Pap test every three years. For women ages 23-65, they may recommend the Pap test and pelvic exam, combined with testing for human papilloma virus (HPV), every five years. Some types of HPV increase your risk of cervical cancer. Testing for HPV may also be done on women of any age who have unclear Pap test results.  Other health care providers may not recommend any screening for nonpregnant women who are considered low risk for pelvic cancer and have no symptoms. Ask your health care provider if a screening pelvic exam is right for you.  If you have had past treatment for cervical cancer or a condition that could lead to cancer, you need Pap tests and screening for cancer for at least 20 years after your treatment. If Pap tests have been discontinued for you, your risk factors (such as having a new sexual partner) need to be reassessed  to determine if you should start having screenings again. Some women have medical problems that increase the chance of getting cervical cancer. In these cases, your health care provider may recommend that you have screening and Pap tests more often.  If you have a family history of uterine cancer or ovarian cancer, talk with your health care provider about genetic screening.  If you have vaginal bleeding after reaching menopause, tell your health care provider.  There are currently no reliable tests available to screen for ovarian cancer. Lung Cancer  Lung cancer screening is recommended for adults 99-83 years old who are at high risk for lung cancer because of a history of smoking. A yearly low-dose CT scan of the lungs is recommended if you:  Currently smoke.  Have a history of at least 30 pack-years of smoking and you currently smoke or have quit within the past 15 years. A pack-year is smoking an average of one pack of cigarettes per day for one year. Yearly screening should:  Continue until it has been 15 years since you quit.  Stop if you develop a health problem that would prevent you from having lung cancer treatment. Colorectal Cancer  This type of cancer can be detected and can often be prevented.  Routine colorectal cancer screening usually begins at age 72 and continues  through age 75.  If you have risk factors for colon cancer, your health care provider may recommend that you be screened at an earlier age.  If you have a family history of colorectal cancer, talk with your health care provider about genetic screening.  Your health care provider may also recommend using home test kits to check for hidden blood in your stool.  A small camera at the end of a tube can be used to examine your colon directly (sigmoidoscopy or colonoscopy). This is done to check for the earliest forms of colorectal cancer.  Direct examination of the colon should be repeated every 5-10 years until  age 75. However, if early forms of precancerous polyps or small growths are found or if you have a family history or genetic risk for colorectal cancer, you may need to be screened more often. Skin Cancer  Check your skin from head to toe regularly.  Monitor any moles. Be sure to tell your health care provider:  About any new moles or changes in moles, especially if there is a change in a mole's shape or color.  If you have a mole that is larger than the size of a pencil eraser.  If any of your family members has a history of skin cancer, especially at a young age, talk with your health care provider about genetic screening.  Always use sunscreen. Apply sunscreen liberally and repeatedly throughout the day.  Whenever you are outside, protect yourself by wearing long sleeves, pants, a wide-brimmed hat, and sunglasses. What should I know about osteoporosis? Osteoporosis is a condition in which bone destruction happens more quickly than new bone creation. After menopause, you may be at an increased risk for osteoporosis. To help prevent osteoporosis or the bone fractures that can happen because of osteoporosis, the following is recommended:  If you are 19-50 years old, get at least 1,000 mg of calcium and at least 600 mg of vitamin D per day.  If you are older than age 50 but younger than age 70, get at least 1,200 mg of calcium and at least 600 mg of vitamin D per day.  If you are older than age 70, get at least 1,200 mg of calcium and at least 800 mg of vitamin D per day. Smoking and excessive alcohol intake increase the risk of osteoporosis. Eat foods that are rich in calcium and vitamin D, and do weight-bearing exercises several times each week as directed by your health care provider. What should I know about how menopause affects my mental health? Depression may occur at any age, but it is more common as you become older. Common symptoms of depression include:  Low or sad  mood.  Changes in sleep patterns.  Changes in appetite or eating patterns.  Feeling an overall lack of motivation or enjoyment of activities that you previously enjoyed.  Frequent crying spells. Talk with your health care provider if you think that you are experiencing depression. What should I know about immunizations? It is important that you get and maintain your immunizations. These include:  Tetanus, diphtheria, and pertussis (Tdap) booster vaccine.  Influenza every year before the flu season begins.  Pneumonia vaccine.  Shingles vaccine. Your health care provider may also recommend other immunizations. This information is not intended to replace advice given to you by your health care provider. Make sure you discuss any questions you have with your health care provider. Document Released: 12/18/2005 Document Revised: 05/15/2016 Document Reviewed: 07/30/2015 Elsevier Interactive Patient   Education  2017 [employer]  

## 2017-01-13 NOTE — Assessment & Plan Note (Signed)
Continue Xyzal and Flonase prn

## 2017-01-13 NOTE — Assessment & Plan Note (Signed)
-

## 2017-01-14 LAB — HEPATITIS C ANTIBODY: HCV Ab: NEGATIVE

## 2017-01-15 ENCOUNTER — Encounter: Payer: 59 | Admitting: Internal Medicine

## 2017-01-20 ENCOUNTER — Ambulatory Visit: Payer: 59 | Admitting: Family Medicine

## 2017-01-25 ENCOUNTER — Ambulatory Visit (INDEPENDENT_AMBULATORY_CARE_PROVIDER_SITE_OTHER): Payer: 59 | Admitting: Family Medicine

## 2017-01-25 ENCOUNTER — Encounter: Payer: Self-pay | Admitting: Family Medicine

## 2017-01-25 VITALS — BP 140/84 | HR 68 | Temp 98.5°F | Ht 62.0 in | Wt 203.5 lb

## 2017-01-25 DIAGNOSIS — M654 Radial styloid tenosynovitis [de Quervain]: Secondary | ICD-10-CM

## 2017-01-25 DIAGNOSIS — M65321 Trigger finger, right index finger: Secondary | ICD-10-CM

## 2017-01-25 NOTE — Progress Notes (Signed)
Pre visit review using our clinic review tool, if applicable. No additional management support is needed unless otherwise documented below in the visit note. 

## 2017-01-25 NOTE — Progress Notes (Signed)
Dr. Frederico Hamman T. Dayonna Selbe, MD, [facility] [place], [address] Phone: [phone] Fax: [phone]  01/25/2017  Patient: Jill Gonzalez, MRN: [id number], DOB: 06-Jun-1956, 61 y.o.  Primary Physician:  Webb Silversmith, NP   Chief Complaint  Patient presents with  . Trigger Finger    Right Index Finger   Subjective:   Jill Gonzalez is a 61 y.o. very pleasant female patient who presents with the following:  s/p 2 inj in the R trigger finger. Recurrence of the triggering.  De Q on the R 1st digit. 2-3 weeks. She had L DeQ more than 1 year ago. Pain limited to the 1st dorsal compartment.  Past Medical History, Surgical History, Social History, Family History, Problem List, Medications, and Allergies have been reviewed and updated if relevant.  Patient Active Problem List   Diagnosis Date Noted  . Breast cancer of lower-outer quadrant of left female breast ([place]) 05/21/2014  . Allergic rhinitis 05/08/2014  . GERD (gastroesophageal reflux disease) 05/08/2014  . Hypothyroid 05/08/2014  . Arthritis 05/08/2014  . Family history of breast cancer 05/08/2014    Past Medical History:  Diagnosis Date  . Allergy   . Arthritis   . Bronchitis   . GERD (gastroesophageal reflux disease)   . Hot flashes   . Phlebitis   . PONV (postoperative nausea and vomiting)   . Thyroid disease     Past Surgical History:  Procedure Laterality Date  . BREAST BIOPSY Bilateral 2011, 2012  . BREAST LUMPECTOMY     left  . CERVICAL CONE BIOPSY  1992  . CESAREAN SECTION  1981  . MOUTH SURGERY    . TONSILLECTOMY    . TUBAL LIGATION Right 1972    Social History   Social History  . Marital status: Married    Spouse name: N/A  . Number of children: 2  . Years of education: N/A   Occupational History  . 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 Smoking status: Never Smoker  . Smokeless  tobacco: Never Used  . Alcohol use No  . Drug use: No  . Sexual activity: Yes   Other Topics Concern  . Not on file   Social History Narrative  . No narrative on file    Family History  Problem Relation Age of Onset  . Arthritis Mother   . Cancer Mother 19    Breast alive age 22  . Hyperlipidemia Mother   . Heart disease Mother   . Hypertension Mother   . Stomach cancer Father   . Cancer Paternal Uncle   . Stroke Brother   . Diabetes Brother   . Hypertension Sister   . Colon cancer Neg Hx   . Pancreatic cancer Neg Hx     Allergies  Allergen Reactions  . Sulfa Antibiotics Rash    fever    Medication list reviewed and updated in full in [facility].  GEN: No fevers, chills. Nontoxic. Primarily MSK c/o today. MSK: Detailed in the HPI GI: tolerating PO intake without difficulty Neuro: No numbness, parasthesias, or tingling associated. Otherwise the pertinent positives of the ROS are noted above.   Objective:   BP 140/84   Pulse 68   Temp 98.5 F (36.9 C) (Oral)   Ht 5\' 2"  (1.575 m)   Wt 203 lb 8 oz (92.3 kg)   BMI 37.22 kg/m    GEN: WDWN,  NAD, Non-toxic, Alert & Oriented x 3 HEENT: Atraumatic, Normocephalic.  Ears and Nose: No external deformity. EXTR: No clubbing/cyanosis/edema NEURO: Normal gait.  PSYCH: Normally interactive. Conversant. Not depressed or anxious appearing.  Calm demeanor.   Hand: R Ecchymosis or edema: neg ROM wrist/hand/digits/elbow: full  Carpals, MCP's, digits: NT Distal Ulna and Radius: NT Supination lift test: neg Ecchymosis or edema: neg Cysts/nodules: neg Finkelstein's test: + 2nd digit triggering Snuffbox tenderness: neg Scaphoid tubercle: NT Hook of Hamate: NT Resisted supination: NT Full composite fist Grip, all digits: 5/5 str Axial load test: neg Atrophy: neg  Hand sensation: intact   Radiology: No results found.  Assessment and Plan:   Trigger finger, right index finger - Plan: Ambulatory referral  to Hand Surgery  De Quervain's tenosynovitis, right  Recurrence after 2 injections - f/u with hand surgery  DeQ on the R. Cont with NSAIDS and place in thumb spica splint.  Follow-up: No Follow-up on file.  Medications Discontinued During This Encounter  Medication Reason  . POTASSIUM CITRATE PO Completed Course   Orders Placed This Encounter  Procedures  . Ambulatory referral to Hand Surgery    Signed,  Frederico Hamman T. Kannon Granderson, MD   Allergies as of 01/25/2017      Reactions   Sulfa Antibiotics Rash   fever      Medication List       Accurate as of 01/25/17  1:56 PM. Always use your most recent med list.          anastrozole 1 MG tablet Commonly known as:  ARIMIDEX TAKE 1 TABLET (1 MG TOTAL) BY MOUTH DAILY. PATIENT PREFERS BRAND NAME DRUG   diclofenac 75 MG EC tablet Commonly known as:  VOLTAREN Take 75 mg by mouth 2 (two) times daily.   FLAX SEED OIL PO Take 2 capsules by mouth daily.   fluticasone 50 MCG/ACT nasal spray Commonly known as:  FLONASE Place into the nose.   levocetirizine 5 MG tablet Commonly known as:  XYZAL Take 1 tablet (5 mg total) by mouth every evening. PLEASE SCHEDULE ANNUAL EXAM   NON FORMULARY Take 2 capsules by mouth daily. 24/7 Solution--Sugar control   omeprazole 40 MG capsule Commonly known as:  PRILOSEC Take 1 capsule (40 mg total) by mouth daily.   Vitamin D3 5000 units Caps Take 2 capsules by mouth daily.

## 2017-01-25 NOTE — Patient Instructions (Signed)

## 2017-02-03 ENCOUNTER — Other Ambulatory Visit: Payer: Self-pay | Admitting: Internal Medicine

## 2017-02-17 ENCOUNTER — Other Ambulatory Visit: Payer: Self-pay

## 2017-02-17 DIAGNOSIS — C50512 Malignant neoplasm of lower-outer quadrant of left female breast: Secondary | ICD-10-CM

## 2017-02-17 MED ORDER — ANASTROZOLE 1 MG PO TABS: 1.0000 mg | ORAL_TABLET | Freq: Every day | ORAL | 0 refills | Status: DC

## 2017-03-30 ENCOUNTER — Other Ambulatory Visit: Payer: Self-pay

## 2017-03-30 ENCOUNTER — Other Ambulatory Visit: Payer: Self-pay | Admitting: Hematology and Oncology

## 2017-03-30 DIAGNOSIS — Z853 Personal history of malignant neoplasm of breast: Secondary | ICD-10-CM

## 2017-03-30 NOTE — Assessment & Plan Note (Signed)
Left breast IDC with papillary features T1 C. N0 M0 ER/PR 100% positive HER-2 amplified ratio 2.28 patient refused Herceptin based chemotherapy and is currently on antiestrogen therapy with anastrozole 1 mg daily since 07/30/2014.  Anastrozole toxicities: 1. Hot flashes 2. Occasional breast tenderness especially when she is chocolate or coffee  Breast cancer surveillance: 1. Breasts exam done 03/31/2017 is normal 2. Mammogram 04/30/2016, postsurgical changes; breast density category B  Continuing to monitor for toxicities.  Return to clinic in 1 year for follow-up

## 2017-03-31 ENCOUNTER — Encounter: Payer: Self-pay | Admitting: Hematology and Oncology

## 2017-03-31 ENCOUNTER — Ambulatory Visit (HOSPITAL_BASED_OUTPATIENT_CLINIC_OR_DEPARTMENT_OTHER): Payer: 59 | Admitting: Hematology and Oncology

## 2017-03-31 VITALS — BP 141/79 | HR 69 | Temp 98.1°F | Resp 18 | Ht 62.0 in | Wt 199.6 lb

## 2017-03-31 DIAGNOSIS — Z170 Estrogen receptor positive status [ER+]: Secondary | ICD-10-CM

## 2017-03-31 DIAGNOSIS — Z780 Asymptomatic menopausal state: Secondary | ICD-10-CM

## 2017-03-31 DIAGNOSIS — C50512 Malignant neoplasm of lower-outer quadrant of left female breast: Secondary | ICD-10-CM

## 2017-03-31 MED ORDER — ANASTROZOLE 1 MG PO TABS: 1.0000 mg | ORAL_TABLET | Freq: Every day | ORAL | 3 refills | Status: DC

## 2017-03-31 NOTE — Progress Notes (Signed)
Patient Care Team: Jearld Fenton, NP as PCP - General (Internal Medicine) Alphonsa Overall, MD as Consulting Physician (General Surgery) Thea Silversmith, MD (Inactive) as Consulting Physician (Radiation Oncology)  DIAGNOSIS:  Encounter Diagnosis  Name Primary?  . Malignant neoplasm of lower-outer quadrant of left breast of female, estrogen receptor positive ([facility])     SUMMARY OF ONCOLOGIC HISTORY:   Breast cancer of lower-outer quadrant of left female breast ([facility])   06/22/2012 Mammogram    Left breast abnormality. Rec Biopsy but patient refused      04/27/2014 Mammogram    Mass with microcalcifications      05/21/2014 Initial Diagnosis    Breast cancer of lower-outer quadrant of left female breast      05/27/2014 Initial Biopsy    Left Breast DCIS with papillary features;  left internal mammary lymph node benign; right breast biopsy benign      05/29/2014 Surgery    Left lumpectomy and sentinel lymph node biopsy: Invasive ductal carcinoma with papillary features grade 2/3 1.8 cm with low-grade DCIS 2 sentinel lymph nodes negative PR 100% PR 99% HER-2 2.28 with gene copy #2.85 (Positive).      07/25/2014 -  Chemotherapy    Recommended chemotherapy plus Herceptin: patient refused      07/30/2014 -  Anti-estrogen oral therapy     adjuvant anastrozole 1 mg daily       CHIEF COMPLIANT: Follow-up on adjuvant anastrozole therapy  INTERVAL HISTORY: Jill Gonzalez is a 61 year old with above-mentioned history of left breast cancer treated with lumpectomy. She was HER-2 positive and refused adjuvant Herceptin. She is currently on adjuvant antiestrogen therapy appears to be tolerating it fairly well. She does have intermittent myalgias and trigger finger. Does have occasional hot flashes. Denies any lumps or nodules in the breasts.  REVIEW OF SYSTEMS:   Constitutional: Denies fevers, chills or abnormal weight loss Eyes: Denies blurriness of vision Ears, nose, mouth, throat, and  face: Denies mucositis or sore throat Respiratory: Denies cough, dyspnea or wheezes Cardiovascular: Denies palpitation, chest discomfort Gastrointestinal:  Denies nausea, heartburn or change in bowel habits Skin: Denies abnormal skin rashes Lymphatics: Denies new lymphadenopathy or easy bruising Neurological:Denies numbness, tingling or new weaknesses Behavioral/Psych: Mood is stable, no new changes  Extremities: No lower extremity edema Breast:  denies any pain or lumps or nodules in either breasts All other systems were reviewed with the patient and are negative.  I have reviewed the past medical history, past surgical history, social history and family history with the patient and they are unchanged from previous note.  ALLERGIES:  is allergic to sulfa antibiotics.  MEDICATIONS:  Current Outpatient Prescriptions  Medication Sig Dispense Refill  . anastrozole (ARIMIDEX) 1 MG tablet Take 1 tablet (1 mg total) by mouth daily. Patient prefers Brand name drug 90 tablet 0  . Cholecalciferol (VITAMIN D3) 5000 UNITS CAPS Take 2 capsules by mouth daily.    . diclofenac (VOLTAREN) 75 MG EC tablet Take 75 mg by mouth 2 (two) times daily.    . Flaxseed, Linseed, (FLAX SEED OIL PO) Take 2 capsules by mouth daily.    . fluticasone (FLONASE) 50 MCG/ACT nasal spray Place into the nose.    . ibuprofen (ADVIL,MOTRIN) 600 MG tablet TAKE 1 TABLET BY MOUTH EVERY 8 HOURS AS NEEDED. 30 tablet 3  . levocetirizine (XYZAL) 5 MG tablet Take 1 tablet (5 mg total) by mouth every evening. PLEASE SCHEDULE ANNUAL EXAM (Patient taking differently: Take 5 mg by mouth every evening. )  90 tablet 0  . NON FORMULARY Take 2 capsules by mouth daily. 24/7 Solution--Sugar control    . omeprazole (PRILOSEC) 40 MG capsule Take 1 capsule (40 mg total) by mouth daily. 90 capsule 3   No current facility-administered medications for this visit.     PHYSICAL EXAMINATION: ECOG PERFORMANCE STATUS: 1 - Symptomatic but completely  ambulatory  Vitals:   03/31/17 0854  BP: (!) 141/79  Pulse: 69  Resp: 18  Temp: 98.1 F (36.7 C)   Filed Weights   03/31/17 0854  Weight: 199 lb 9.6 oz (90.5 kg)    GENERAL:alert, no distress and comfortable SKIN: skin color, texture, turgor are normal, no rashes or significant lesions EYES: normal, Conjunctiva are pink and non-injected, sclera clear OROPHARYNX:no exudate, no erythema and lips, buccal mucosa, and tongue normal  NECK: supple, thyroid normal size, non-tender, without nodularity LYMPH:  no palpable lymphadenopathy in the cervical, axillary or inguinal LUNGS: clear to auscultation and percussion with normal breathing effort HEART: regular rate & rhythm and no murmurs and no lower extremity edema ABDOMEN:abdomen soft, non-tender and normal bowel sounds MUSCULOSKELETAL:no cyanosis of digits and no clubbing  NEURO: alert & oriented x 3 with fluent speech, no focal motor/sensory deficits EXTREMITIES: No lower extremity edema BREAST: No palpable masses or nodules in either right or left breasts. No palpable axillary supraclavicular or infraclavicular adenopathy no breast tenderness or nipple discharge. (exam performed in the presence of a chaperone)  LABORATORY DATA:  I have reviewed the data as listed   Chemistry      Component Value Date/Time   NA 141 01/13/2017 1503   NA 143 06/04/2015 0838   K 3.8 01/13/2017 1503   K 3.5 06/04/2015 0838   CL 102 01/13/2017 1503   CO2 32 01/13/2017 1503   CO2 30 (H) 06/04/2015 0838   BUN 7 01/13/2017 1503   BUN 10.4 06/04/2015 0838   CREATININE 0.57 01/13/2017 1503   CREATININE 0.7 06/04/2015 0838      Component Value Date/Time   CALCIUM 9.6 01/13/2017 1503   CALCIUM 9.2 06/04/2015 0838   ALKPHOS 66 01/13/2017 1503   ALKPHOS 73 06/04/2015 0838   AST 18 01/13/2017 1503   AST 19 06/04/2015 0838   ALT 13 01/13/2017 1503   ALT 15 06/04/2015 0838   BILITOT 0.5 01/13/2017 1503   BILITOT 0.47 06/04/2015 0838       Lab  Results  Component Value Date   WBC 4.9 01/13/2017   HGB 11.9 (L) 01/13/2017   HCT 35.6 (L) 01/13/2017   MCV 94.8 01/13/2017   PLT 279.0 01/13/2017   NEUTROABS 1.9 06/04/2015    ASSESSMENT & PLAN:  Breast cancer of lower-outer quadrant of left female breast Left breast IDC with papillary features T1 C. N0 M0 ER/PR 100% positive HER-2 amplified ratio 2.28 patient refused Herceptin based chemotherapy and is currently on antiestrogen therapy with anastrozole 1 mg daily since 07/30/2014.  Anastrozole toxicities: 1. Hot flashes 2. Occasional breast tenderness especially when she is chocolate or coffee  Breast cancer surveillance: 1. Breasts exam done 03/31/2017 is normal 2. Mammogram 04/30/2016, postsurgical changes; breast density category B We will order a bone density test to be done along with her mammograms.  Continuing to monitor for toxicities. Return to clinic in 1 year for follow-up   I spent 25 minutes talking to the patient of which more than half was spent in counseling and coordination of care.  No orders of the defined types were placed in  this encounter.  The patient has a good understanding of the overall plan. she agrees with it. she will call with any problems that may develop before the next visit here.   Rulon Eisenmenger, MD 03/31/17

## 2017-05-03 ENCOUNTER — Other Ambulatory Visit: Payer: 59

## 2017-05-05 ENCOUNTER — Ambulatory Visit
Admission: RE | Admit: 2017-05-05 | Discharge: 2017-05-05 | Disposition: A | Discharge status: Home / Self Care | Payer: 59 | Source: Ambulatory Visit | Attending: Hematology and Oncology | Admitting: Hematology and Oncology

## 2017-05-05 DIAGNOSIS — Z780 Asymptomatic menopausal state: Secondary | ICD-10-CM

## 2017-05-05 DIAGNOSIS — Z853 Personal history of malignant neoplasm of breast: Secondary | ICD-10-CM

## 2017-08-31 ENCOUNTER — Ambulatory Visit (INDEPENDENT_AMBULATORY_CARE_PROVIDER_SITE_OTHER): Payer: 59 | Admitting: Internal Medicine

## 2017-08-31 ENCOUNTER — Encounter: Payer: Self-pay | Admitting: Internal Medicine

## 2017-08-31 VITALS — BP 128/78 | HR 85 | Temp 98.1°F | Wt 200.0 lb

## 2017-08-31 DIAGNOSIS — M545 Low back pain, unspecified: Secondary | ICD-10-CM

## 2017-08-31 DIAGNOSIS — J302 Other seasonal allergic rhinitis: Secondary | ICD-10-CM

## 2017-08-31 DIAGNOSIS — Z23 Encounter for immunization: Secondary | ICD-10-CM

## 2017-08-31 MED ORDER — IBUPROFEN 600 MG PO TABS: 600.0000 mg | ORAL_TABLET | Freq: Three times a day (TID) | ORAL | 2 refills | Status: DC | PRN

## 2017-08-31 MED ORDER — METHOCARBAMOL 500 MG PO TABS: 500.0000 mg | ORAL_TABLET | Freq: Three times a day (TID) | ORAL | 0 refills | Status: DC | PRN

## 2017-08-31 MED ORDER — LEVOCETIRIZINE DIHYDROCHLORIDE 5 MG PO TABS
5.0000 mg | ORAL_TABLET | Freq: Every evening | ORAL | 0 refills | Status: DC
Start: 2017-08-31 — End: 2017-11-16

## 2017-08-31 NOTE — Patient Instructions (Signed)

## 2017-08-31 NOTE — Progress Notes (Addendum)
Subjective:    Patient ID: Jill Gonzalez, female    DOB: 11/24/1955, 61 y.o.   MRN: [id number]  HPI  Pt presents to the clinic today with c/o back pain. She reports this started 1 month ago. The pain started in her left lower back/buttocks and has radiated over to the right side. The pain is worse with laying down and sitting, better with walking or standing. She denies pain, numbness or tingling in her legs. She denies loss of bowel or bladder. She denies any injury to the area. She has tried Ibuprofen and heat with some relief.   She would also like a refill of her Xyzal and Ibuprofen.   She would also like a flu shot today.  Review of Systems      Past Medical History:  Diagnosis Date  . Allergy   . Arthritis   . Bronchitis   . GERD (gastroesophageal reflux disease)   . Hot flashes   . Phlebitis   . PONV (postoperative nausea and vomiting)   . Thyroid disease     Current Outpatient Prescriptions  Medication Sig Dispense Refill  . anastrozole (ARIMIDEX) 1 MG tablet Take 1 tablet (1 mg total) by mouth daily. Patient prefers Brand name drug 90 tablet 3  . Cholecalciferol (VITAMIN D3) 5000 UNITS CAPS Take 2 capsules by mouth daily.    . Flaxseed, Linseed, (FLAX SEED OIL PO) Take 2 capsules by mouth daily.    . fluticasone (FLONASE) 50 MCG/ACT nasal spray Place into the nose.    . ibuprofen (ADVIL,MOTRIN) 600 MG tablet TAKE 1 TABLET BY MOUTH EVERY 8 HOURS AS NEEDED. 30 tablet 3  . levocetirizine (XYZAL) 5 MG tablet Take 1 tablet (5 mg total) by mouth every evening. PLEASE SCHEDULE ANNUAL EXAM (Patient taking differently: Take 5 mg by mouth every evening. ) 90 tablet 0  . NON FORMULARY Take 2 capsules by mouth daily. 24/7 Solution--Sugar control    . omeprazole (PRILOSEC) 40 MG capsule Take 1 capsule (40 mg total) by mouth daily. 90 capsule 3   No current facility-administered medications for this visit.     Allergies  Allergen Reactions  . Sulfa Antibiotics Rash   fever    Family History  Problem Relation Age of Onset  . Arthritis Mother   . Cancer Mother 58       Breast alive age 65  . Hyperlipidemia Mother   . Heart disease Mother   . Hypertension Mother   . Stomach cancer Father   . Cancer Paternal Uncle   . Stroke Brother   . Diabetes Brother   . Hypertension Sister   . Colon cancer Neg Hx   . Pancreatic cancer Neg Hx     Social History   Social History  . Marital status: Married    Spouse name: N/A  . Number of children: 2  . Years of education: N/A   Occupational History  . Secretary [place]   Social History Main Topics  . Smoking status: Never Smoker  . Smokeless tobacco: Never Used  . Alcohol use No  . Drug use: No  . Sexual activity: Yes   Other Topics Concern  . Not on file   Social History Narrative  . No narrative on file     Constitutional: Denies fever, malaise, fatigue, headache or abrupt weight changes.  Gastrointestinal: Denies abdominal pain, bloating, constipation, diarrhea or blood in the stool.  GU: Denies urgency, frequency, pain with urination,  burning sensation, blood in urine, odor or discharge. Musculoskeletal: Pt reports back pain. Denies decrease in range of motion, difficulty with gait, or joint swelling.  Neurological: Denies dizziness, difficulty with memory, difficulty with speech or problems with balance and coordination.    No other specific complaints in a complete review of systems (except as listed in HPI above).  Objective:   Physical Exam   BP 128/78   Pulse 85   Temp 98.1 F (36.7 C) (Oral)   Wt 200 lb (90.7 kg)   SpO2 98%   BMI 36.58 kg/m  Wt Readings from Last 3 Encounters:  08/31/17 200 lb (90.7 kg)  03/31/17 199 lb 9.6 oz (90.5 kg)  01/25/17 203 lb 8 oz (92.3 kg)    General: Appears her stated age, obese in NAD. Musculoskeletal: Normal flexion, extension and rotation of the spine. Mild bony tenderness noted over the lumbar spine and  bilateral SI joints. Normal flexion, extension of bilateral hips. Normal internal rotation, external rotation, abduction and adduction of hips. Strength 4/5 LLE, 5/5 RLE. No difficulty with gait. Neurological: Alert and oriented. Sensation intact to BLE.  BMET    Component Value Date/Time   NA 141 01/13/2017 1503   NA 143 06/04/2015 0838   K 3.8 01/13/2017 1503   K 3.5 06/04/2015 0838   CL 102 01/13/2017 1503   CO2 32 01/13/2017 1503   CO2 30 (H) 06/04/2015 0838   GLUCOSE 88 01/13/2017 1503   GLUCOSE 95 06/04/2015 0838   BUN 7 01/13/2017 1503   BUN 10.4 06/04/2015 0838   CREATININE 0.57 01/13/2017 1503   CREATININE 0.7 06/04/2015 0838   CALCIUM 9.6 01/13/2017 1503   CALCIUM 9.2 06/04/2015 0838    Lipid Panel     Component Value Date/Time   CHOL 215 (H) 01/13/2017 1503   TRIG 91.0 01/13/2017 1503   HDL 59.90 01/13/2017 1503   CHOLHDL 4 01/13/2017 1503   VLDL 18.2 01/13/2017 1503   LDLCALC 137 (H) 01/13/2017 1503    CBC    Component Value Date/Time   WBC 4.9 01/13/2017 1503   RBC 3.75 (L) 01/13/2017 1503   HGB 11.9 (L) 01/13/2017 1503   HGB 12.0 06/04/2015 0838   HCT 35.6 (L) 01/13/2017 1503   HCT 35.9 06/04/2015 0838   PLT 279.0 01/13/2017 1503   PLT 269 06/04/2015 0838   MCV 94.8 01/13/2017 1503   MCV 94.5 06/04/2015 0838   MCH 31.6 06/04/2015 0838   MCHC 33.5 01/13/2017 1503   RDW 13.5 01/13/2017 1503   RDW 13.0 06/04/2015 0838   LYMPHSABS 1.4 06/04/2015 0838   MONOABS 0.5 06/04/2015 0838   EOSABS 0.1 06/04/2015 0838   BASOSABS 0.0 06/04/2015 0838    Hgb A1C Lab Results  Component Value Date   HGBA1C 5.2 01/13/2017           Assessment & Plan:   Low Back Pain:  Seems muscular in origin Discussed how weight loss and core strengthening can help with your back pain Continue Ibuprofen, refilled today Continue heat Stretching exercises given eRx for Methocarbamol 500 mg TID prn  Seasonal Allergies:  Xyzal refilled today  Need for  Influenza Vaccine:  Flu shot today  Return precautions discussed

## 2017-09-14 ENCOUNTER — Encounter: Payer: Self-pay | Admitting: Internal Medicine

## 2017-09-14 ENCOUNTER — Ambulatory Visit: Payer: 59 | Admitting: Internal Medicine

## 2017-09-14 VITALS — BP 130/78 | HR 76 | Temp 98.2°F | Wt 202.0 lb

## 2017-09-14 DIAGNOSIS — J301 Allergic rhinitis due to pollen: Secondary | ICD-10-CM

## 2017-09-14 MED ORDER — HYDROCOD POLST-CPM POLST ER 10-8 MG/5ML PO SUER: 5.0000 mL | Freq: Every evening | ORAL | 0 refills | Status: DC | PRN

## 2017-09-14 NOTE — Addendum Note (Signed)
Addended by: Lurlean Nanny on: 09/14/2017 04:34 PM   Modules accepted: Orders

## 2017-09-14 NOTE — Patient Instructions (Signed)

## 2017-09-17 ENCOUNTER — Encounter: Payer: Self-pay | Admitting: Internal Medicine

## 2017-09-17 ENCOUNTER — Ambulatory Visit: Payer: Self-pay

## 2017-09-17 MED ORDER — AMOXICILLIN-POT CLAVULANATE 875-125 MG PO TABS: 1.0000 | ORAL_TABLET | Freq: Two times a day (BID) | ORAL | 0 refills | Status: DC

## 2017-09-17 NOTE — Addendum Note (Signed)
Addended by: Jearld Fenton on: 09/17/2017 11:46 AM   Modules accepted: Orders

## 2017-09-17 NOTE — Telephone Encounter (Signed)
Augmentin sent to pharmacy.

## 2017-09-17 NOTE — Telephone Encounter (Signed)
Linus Orn RN also noted; Triage note. Pt. Saw [employer]. No better. Would like something called in. Thanks. (Routing comment) . Pt saw Avie Echevaria NP on 09/14/17.

## 2017-09-17 NOTE — Progress Notes (Signed)
Subjective:    Patient ID: Jill Gonzalez, female    DOB: October 22, 1956, 61 y.o.   MRN: [id number]  HPI  Pt presents to the clinic today with c/o bilateral ear fullness, runny nose, nasal congestion and cough. This started 1 week ago. She denies ear pain or decreased hearing. She is blowing clear/yellow mucous out of her nose. The cough is productive of clear/yellow mucous as well. She denies fever, chills or body aches. She has tried Occupational hygienist with minimal relief. She has not had sick contacts that she is aware of.  Review of Systems  Past Medical History:  Diagnosis Date  . Allergy   . Arthritis   . Bronchitis   . GERD (gastroesophageal reflux disease)   . Hot flashes   . Phlebitis   . PONV (postoperative nausea and vomiting)   . Thyroid disease     Current Outpatient Medications  Medication Sig Dispense Refill  . anastrozole (ARIMIDEX) 1 MG tablet Take 1 tablet (1 mg total) by mouth daily. Patient prefers Brand name drug 90 tablet 3  . Calcium Carbonate-Vit D-Min (CALCIUM 1200 PO) Take 1 tablet by mouth daily.    . Cholecalciferol (VITAMIN D3) 5000 UNITS CAPS Take 2 capsules by mouth daily.    . Flaxseed, Linseed, (FLAX SEED OIL PO) Take 2 capsules by mouth daily.    . fluticasone (FLONASE) 50 MCG/ACT nasal spray Place into the nose.    . ibuprofen (ADVIL,MOTRIN) 600 MG tablet Take 1 tablet (600 mg total) by mouth every 8 (eight) hours as needed. 30 tablet 2  . levocetirizine (XYZAL) 5 MG tablet Take 1 tablet (5 mg total) by mouth every evening. 90 tablet 0  . methocarbamol (ROBAXIN) 500 MG tablet Take 1 tablet (500 mg total) by mouth every 8 (eight) hours as needed for muscle spasms. 20 tablet 0  . NON FORMULARY Take 2 capsules by mouth daily. 24/7 Solution--Sugar control    . omeprazole (PRILOSEC) 40 MG capsule Take 1 capsule (40 mg total) by mouth daily. 90 capsule 3  . amoxicillin-clavulanate (AUGMENTIN) 875-125 MG tablet Take 1 tablet 2 (two) times daily by mouth.  20 tablet 0  . chlorpheniramine-HYDROcodone (TUSSIONEX PENNKINETIC ER) 10-8 MG/5ML SUER Take 5 mLs at bedtime as needed by mouth. 140 mL 0   No current facility-administered medications for this visit.     Allergies  Allergen Reactions  . Sulfa Antibiotics Rash    fever    Family History  Problem Relation Age of Onset  . Arthritis Mother   . Cancer Mother 66       Breast alive age 79  . Hyperlipidemia Mother   . Heart disease Mother   . Hypertension Mother   . Stomach cancer Father   . Cancer Paternal Uncle   . Stroke Brother   . Diabetes Brother   . Hypertension Sister   . Colon cancer Neg Hx   . Pancreatic cancer Neg Hx     Social History   Socioeconomic History  . Marital status: Married    Spouse name: Not on file  . Number of children: 2  . Years of education: Not on file  . Highest education level: Not on file  Social Needs  . Financial resource strain: Not on file  . Food insecurity - worry: Not on file  . Food insecurity - inability: Not on file  . Transportation needs - medical: Not on file  . Transportation needs - non-medical: Not on file  Occupational  History  . 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 Smoking status: Never Smoker  . Smokeless tobacco: Never Used  Substance and Sexual Activity  . Alcohol use: No    Alcohol/week: 0.0 oz  . Drug use: No  . Sexual activity: Yes  Other Topics Concern  . Not on file  Social History Narrative  . Not on file     Constitutional: Denies fever, malaise, fatigue, headache or abrupt weight changes.  HEENT: Pt reports ear fullness, runny nose, nasal congestion. Denies eye pain, eye redness, ear pain, ringing in the ears, wax buildup, bloody nose, or sore throat. Respiratory: Pt reports cough. Denies difficulty breathing, shortness of breath.     No other specific complaints in a complete review of systems (except as listed in HPI above).       Objective:   Physical Exam  BP 130/78   Pulse 76   Temp 98.2 F (36.8 C) (Oral)   Wt 202 lb (91.6 kg)   SpO2 98%   BMI 36.95 kg/m  Wt Readings from Last 3 Encounters:  09/14/17 202 lb (91.6 kg)  08/31/17 200 lb (90.7 kg)  03/31/17 199 lb 9.6 oz (90.5 kg)    General: Appears her stated age, in NAD. HEENT: Head: normal shape and size, no sinus tenderness noted;Ears: Tm's gray and intact, normal light reflex; Nose: mucosa boggy and moist, turbinates swollen; Throat/Mouth: Teeth present, mucosa pink and moist, + PND, no exudate, lesions or ulcerations noted.  Neck:  No adenopathy noted Pulmonary/Chest: Normal effort and positive vesicular breath sounds. No respiratory distress. No wheezes, rales or ronchi noted.    BMET    Component Value Date/Time   NA 141 01/13/2017 1503   NA 143 06/04/2015 0838   K 3.8 01/13/2017 1503   K 3.5 06/04/2015 0838   CL 102 01/13/2017 1503   CO2 32 01/13/2017 1503   CO2 30 (H) 06/04/2015 0838   GLUCOSE 88 01/13/2017 1503   GLUCOSE 95 06/04/2015 0838   BUN 7 01/13/2017 1503   BUN 10.4 06/04/2015 0838   CREATININE 0.57 01/13/2017 1503   CREATININE 0.7 06/04/2015 0838   CALCIUM 9.6 01/13/2017 1503   CALCIUM 9.2 06/04/2015 0838    Lipid Panel     Component Value Date/Time   CHOL 215 (H) 01/13/2017 1503   TRIG 91.0 01/13/2017 1503   HDL 59.90 01/13/2017 1503   CHOLHDL 4 01/13/2017 1503   VLDL 18.2 01/13/2017 1503   LDLCALC 137 (H) 01/13/2017 1503    CBC    Component Value Date/Time   WBC 4.9 01/13/2017 1503   RBC 3.75 (L) 01/13/2017 1503   HGB 11.9 (L) 01/13/2017 1503   HGB 12.0 06/04/2015 0838   HCT 35.6 (L) 01/13/2017 1503   HCT 35.9 06/04/2015 0838   PLT 279.0 01/13/2017 1503   PLT 269 06/04/2015 0838   MCV 94.8 01/13/2017 1503   MCV 94.5 06/04/2015 0838   MCH 31.6 06/04/2015 0838   MCHC 33.5 01/13/2017 1503   RDW 13.5 01/13/2017 1503   RDW 13.0 06/04/2015 0838   LYMPHSABS 1.4 06/04/2015 0838   MONOABS 0.5 06/04/2015  0838   EOSABS 0.1 06/04/2015 0838   BASOSABS 0.0 06/04/2015 0838    Hgb A1C Lab Results  Component Value Date   HGBA1C 5.2 01/13/2017            Assessment & Plan:   Allergic Rhinitis:  Encouraged her to  continue Xyzal and Flonase Offered 80 mg Depo IM daily, she declines  Return precautions discussed Webb Silversmith, NP

## 2017-09-17 NOTE — Telephone Encounter (Signed)
Pt. Still has cough,headache and congestion. Mucinex is not helping. She is having a lot pressure in sinus and behind her eyes. States Webb Silversmith said to call back if she was no better and she would "call something in."

## 2017-11-16 ENCOUNTER — Other Ambulatory Visit: Payer: Self-pay | Admitting: Internal Medicine

## 2018-03-28 ENCOUNTER — Other Ambulatory Visit: Payer: Self-pay | Admitting: Hematology and Oncology

## 2018-03-28 DIAGNOSIS — Z853 Personal history of malignant neoplasm of breast: Secondary | ICD-10-CM

## 2018-03-29 ENCOUNTER — Telehealth: Payer: Self-pay | Admitting: Hematology and Oncology

## 2018-03-29 NOTE — Telephone Encounter (Signed)
Returned call to patient re rescheduling 5/23 f/u w/[facility]. Per patient she has moved out of the city and lives two hours away now. Per patient she will not be able to have her mammo until 6/28 and wants to reschedule f/u after. Spoke with re new f/u appointment for 7/15 @ 11:30 am - date per patient.

## 2018-03-31 ENCOUNTER — Ambulatory Visit: Payer: 59 | Admitting: Hematology and Oncology

## 2018-05-04 ENCOUNTER — Other Ambulatory Visit: Payer: Self-pay

## 2018-05-04 ENCOUNTER — Other Ambulatory Visit: Payer: Self-pay | Admitting: Hematology and Oncology

## 2018-05-04 DIAGNOSIS — Z853 Personal history of malignant neoplasm of breast: Secondary | ICD-10-CM

## 2018-05-06 ENCOUNTER — Ambulatory Visit
Admission: RE | Admit: 2018-05-06 | Discharge: 2018-05-06 | Disposition: A | Discharge status: Home / Self Care | Payer: BC Managed Care – PPO | Source: Ambulatory Visit | Attending: Hematology and Oncology | Admitting: Hematology and Oncology

## 2018-05-06 DIAGNOSIS — Z853 Personal history of malignant neoplasm of breast: Secondary | ICD-10-CM

## 2018-05-23 ENCOUNTER — Ambulatory Visit: Payer: BC Managed Care – PPO | Admitting: Hematology and Oncology

## 2018-05-23 ENCOUNTER — Telehealth: Payer: Self-pay | Admitting: Hematology and Oncology

## 2018-05-23 NOTE — Assessment & Plan Note (Deleted)
Left breast IDC with papillary featuresT1 C. N0 M0 ER/PR 100% positive HER-2 amplified ratio 2.28 patient refused Herceptin based chemotherapy and is currently on antiestrogen therapy with anastrozole 1 mg daily since 07/30/2014.  Anastrozole toxicities: 1. Hot flashes 2. Occasional breast tenderness especially when she is chocolate or coffee  Breast cancer surveillance: 1. Breasts exam done 05/23/2018 is normal 2. Mammogram She is a 05/06/2017, postsurgical changes; breast density category B  Continuing to monitor for toxicities. Return to clinic in 1 year for follow-up

## 2018-05-23 NOTE — Telephone Encounter (Signed)
patient called to cancel

## 2019-01-03 ENCOUNTER — Ambulatory Visit: Payer: BC Managed Care – PPO | Admitting: Internal Medicine

## 2019-01-03 ENCOUNTER — Encounter: Payer: Self-pay | Admitting: Internal Medicine

## 2019-01-03 VITALS — BP 126/74 | HR 86 | Temp 97.9°F | Wt 189.0 lb

## 2019-01-03 DIAGNOSIS — J0100 Acute maxillary sinusitis, unspecified: Secondary | ICD-10-CM

## 2019-01-03 MED ORDER — IBUPROFEN 600 MG PO TABS: 600.0000 mg | ORAL_TABLET | Freq: Three times a day (TID) | ORAL | 2 refills | Status: AC | PRN

## 2019-01-03 MED ORDER — LEVOCETIRIZINE DIHYDROCHLORIDE 5 MG PO TABS: 5.0000 mg | ORAL_TABLET | Freq: Every evening | ORAL | 0 refills | Status: DC

## 2019-01-03 MED ORDER — FLUTICASONE PROPIONATE 50 MCG/ACT NA SUSP: 2.0000 | Freq: Every day | NASAL | 2 refills | Status: DC

## 2019-01-03 MED ORDER — AMOXICILLIN-POT CLAVULANATE 875-125 MG PO TABS: 1.0000 | ORAL_TABLET | Freq: Two times a day (BID) | ORAL | 0 refills | Status: AC

## 2019-01-03 NOTE — Progress Notes (Signed)
HPI  Pt presents to the clinic today with c/o headache, facial pain and pressure, left ear pain and sore throat. She reports this started 10 days ago. She describes the headache as pressure. She is not blowing anything out of her nose. She describes the ear pain as fullness. She denies ear drainage or decreased hearing. She denies difficulty swallowing. The cough is productive of yellow mucous. She denies fever, chills or body aches. She has tried Delsym, Sudafed, Mucinex, Benadryl and Tylenol with some relief. She has a history of allergies but has been out of her Xyzal and Flonase. She would like a refill of this today. She has had sick contacts.  Review of Systems     Past Medical History:  Diagnosis Date  . Allergy   . Arthritis   . Bronchitis   . GERD (gastroesophageal reflux disease)   . Hot flashes   . Phlebitis   . PONV (postoperative nausea and vomiting)   . Thyroid disease     Family History  Problem Relation Age of Onset  . Arthritis Mother   . Cancer Mother 78       Breast alive age 76  . Hyperlipidemia Mother   . Heart disease Mother   . Hypertension Mother   . Stomach cancer Father   . Cancer Paternal Uncle   . Stroke Brother   . Diabetes Brother   . Hypertension Sister   . Colon cancer Neg Hx   . Pancreatic cancer Neg Hx     Social History   Socioeconomic History  . Marital status: Married    Spouse name: Not on file  . Number of children: 2  . Years of education: Not on file  . Highest education level: Not on file  Occupational History  . 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 Financial resource strain: Not on file  . Food insecurity:    Worry: Not on file    Inability: Not on file  . Transportation needs:    Medical: Not on file    Non-medical: Not on file  Tobacco Use  . Smoking status: Never Smoker  . Smokeless tobacco: Never Used  Substance and Sexual Activity  . Alcohol use: No    Alcohol/week: 0.0 standard drinks  . Drug use: No  . Sexual activity: Yes  Lifestyle  . Physical activity:    Days per week: Not on file    Minutes per session: Not on file  . Stress: Not on file  Relationships  . Social connections:    Talks on phone: Not on file    Gets together: Not on file    Attends religious service: Not on file    Active member of club or organization: Not on file    Attends meetings of clubs or organizations: Not on file    Relationship status: Not on file  . Intimate partner violence:    Fear of current or ex partner: Not on file    Emotionally abused: Not on file    Physically abused: Not on file    Forced sexual activity: Not on file  Other Topics Concern  . Not on file  Social History Narrative  . Not on file    Allergies  Allergen Reactions  . Sulfa Antibiotics Rash    fever     Constitutional: Positive headache. Denies fatigue, fever or abrupt weight changes.  HEENT:  Positive facial pain,  runny nose, nasal congestion and sore throat. Denies eye redness, ear pain, ringing in the ears, wax buildup, or bloody nose. Respiratory: Positive cough. Denies difficulty breathing or shortness of breath.  Cardiovascular: Denies chest pain, chest tightness, palpitations or swelling in the hands or feet.   No other specific complaints in a complete review of systems (except as listed in HPI above).  Objective:   BP 126/74   Pulse 86   Temp 97.9 F (36.6 C) (Oral)   Wt 189 lb (85.7 kg)   SpO2 97%   BMI 34.57 kg/m   General: Appears her stated age, in NAD. HEENT: Head: normal shape and size, maxillary sinus tenderness noted; Ears: Tm's gray and intact, normal light reflex; Nose: mucosa boggy and moist, turbinates swollen; Throat/Mouth: + PND. Teeth present, mucosa erythematous and moist, no exudate noted, no lesions or ulcerations noted.  Neck:  No adenopathy noted.  Cardiovascular: Normal rate and rhythm. S1,S2 noted.  No murmur, rubs or gallops  noted.  Pulmonary/Chest: Normal effort and positive vesicular breath sounds. No respiratory distress. No wheezes, rales or ronchi noted.       Assessment & Plan:   Acute Maxillary Sinusitis  Can use a Neti Pot which can be purchased from your local drug store. Flonase and Xyzal refilled today eRx for Augmentin BID for 10 days  RTC as needed or if symptoms persist. Webb Silversmith, NP

## 2019-01-03 NOTE — Patient Instructions (Signed)

## 2019-02-16 ENCOUNTER — Encounter: Payer: BC Managed Care – PPO | Admitting: Internal Medicine

## 2019-03-27 ENCOUNTER — Other Ambulatory Visit: Payer: Self-pay | Admitting: Internal Medicine

## 2019-04-02 ENCOUNTER — Other Ambulatory Visit: Payer: Self-pay | Admitting: Internal Medicine

## 2019-06-06 ENCOUNTER — Other Ambulatory Visit: Payer: Self-pay | Admitting: Internal Medicine

## 2019-06-26 ENCOUNTER — Other Ambulatory Visit: Payer: Self-pay | Admitting: Internal Medicine

## 2019-07-07 ENCOUNTER — Other Ambulatory Visit: Payer: Self-pay | Admitting: Internal Medicine

## 2019-08-06 ENCOUNTER — Other Ambulatory Visit: Payer: Self-pay | Admitting: Internal Medicine

## 2019-08-26 ENCOUNTER — Other Ambulatory Visit: Payer: Self-pay | Admitting: Internal Medicine

## 2019-09-24 ENCOUNTER — Other Ambulatory Visit: Payer: Self-pay | Admitting: Internal Medicine

## 2019-11-20 ENCOUNTER — Other Ambulatory Visit: Payer: Self-pay | Admitting: Internal Medicine

## 2019-12-15 ENCOUNTER — Other Ambulatory Visit: Payer: Self-pay | Admitting: Internal Medicine

## 2024-07-31 ENCOUNTER — Encounter: Payer: Self-pay | Admitting: Internal Medicine

## 2024-10-18 ENCOUNTER — Telehealth: Payer: Self-pay

## 2024-10-18 NOTE — Telephone Encounter (Signed)
 VM left making the patient aware she is due for a recall colonoscopy with Dr. Avram, VM left for patient to call to have her PV and colonoscopy scheduled.

## 2024-11-13 NOTE — Telephone Encounter (Addendum)
 Called and spoke with patient-patient reports she has moved and had her colonoscopy with an alternate practice and is not planning on coming back to this practice; patient reports she will call the office if she moves back closer to [facility] or if she has other concerns;  Patient advised to call back to the office at [phone] should questions/concerns arise;  Patient verbalized understanding of information/instructions;
